# Patient Record
Sex: Female | Born: 1985 | Race: White | Hispanic: Yes | Marital: Single | Smoking: Never smoker
Health system: Southern US, Community
[De-identification: ages and names within clinical notes are randomized; demographics above are authoritative.]

## PROBLEM LIST (undated history)

## (undated) DIAGNOSIS — G40909 Epilepsy, unspecified, not intractable, without status epilepticus: Secondary | ICD-10-CM

---

## 2012-10-03 ENCOUNTER — Emergency Department (HOSPITAL_COMMUNITY)
Admission: EM | Admit: 2012-10-03 | Discharge: 2012-10-03 | Discharge status: Against Medical Advice | Payer: Self-pay | Attending: Emergency Medicine | Admitting: Emergency Medicine

## 2012-10-03 ENCOUNTER — Encounter (HOSPITAL_COMMUNITY): Payer: Self-pay | Admitting: Adult Health

## 2012-10-03 DIAGNOSIS — IMO0002 Reserved for concepts with insufficient information to code with codable children: Secondary | ICD-10-CM | POA: Insufficient documentation

## 2012-10-03 HISTORY — DX: Epilepsy, unspecified, not intractable, without status epilepticus: G40.909

## 2012-10-03 NOTE — ED Notes (Signed)
Called patient for room placement. No answer
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
## 2016-11-26 ENCOUNTER — Encounter: Payer: Self-pay | Admitting: Pediatric Intensive Care

## 2016-12-11 NOTE — Congregational Nurse Program (Signed)
Congregational Nurse Program Note  Date of Encounter: 11/26/2016  Past Medical History: Past Medical History:  Diagnosis Date  . Epilepsy     Encounter Details:     CNP Questionnaire - 11/26/16 1600      Patient Demographics   Is this a new or existing patient? New   Patient is considered a/an Immigrant   Race Latino/Hispanic     Patient Assistance   Location of Patient Assistance [employer]   Patient's financial/insurance status Self-Pay (Uninsured)   Uninsured Patient ([place] [employer]/Care Connects) Yes   Interventions Assisted patient in making appt.   Patient referred to apply for the following financial assistance Not Applicable   Food insecurities addressed Not Applicable   Transportation assistance No   Assistance securing medications No   Educational health offerings Navigating the healthcare system     Encounter Details   Primary purpose of visit Education/Health Concerns   Was an Emergency Department visit averted? Not Applicable   Does patient have a medical provider? No   Patient referred to Establish PCP   Was a mental health screening completed? (GAINS tool) No   Does patient have dental issues? No   Does patient have vision issues? No   Does your patient have an abnormal blood pressure today? No   Since previous encounter, have you referred patient for abnormal blood pressure that resulted in a new diagnosis or medication change? No   Does your patient have an abnormal blood glucose today? No   Since previous encounter, have you referred patient for abnormal blood glucose that resulted in a new diagnosis or medication change? No   Was there a life-saving intervention made? No    Via interpreter Alcario Droughtrica- client was supposed to come to clinic for evaluation this afternoon but had not transportation. Client describes history of seizures at "night" and was under care of [place]CP in [place] but has not had access to care or medication. Client describes headache and  "yellow eyes". Client would like to be evaluated by doctor locally. CN will call [facility] for appointment and call client back with time.

## 2016-12-23 ENCOUNTER — Ambulatory Visit (INDEPENDENT_AMBULATORY_CARE_PROVIDER_SITE_OTHER): Payer: Self-pay | Admitting: Physician Assistant

## 2017-01-01 ENCOUNTER — Ambulatory Visit (INDEPENDENT_AMBULATORY_CARE_PROVIDER_SITE_OTHER): Payer: Self-pay | Admitting: Physician Assistant

## 2017-01-01 ENCOUNTER — Encounter (INDEPENDENT_AMBULATORY_CARE_PROVIDER_SITE_OTHER): Payer: Self-pay | Admitting: Physician Assistant

## 2017-01-01 VITALS — BP 110/74 | HR 62 | Temp 97.9°F | Wt 174.0 lb

## 2017-01-01 DIAGNOSIS — G40909 Epilepsy, unspecified, not intractable, without status epilepticus: Secondary | ICD-10-CM

## 2017-01-01 MED ORDER — LEVETIRACETAM 250 MG PO TABS: 250.0000 mg | ORAL_TABLET | Freq: Two times a day (BID) | ORAL | 2 refills | Status: DC

## 2017-01-01 MED ORDER — CYCLOBENZAPRINE HCL 10 MG PO TABS: 10.0000 mg | ORAL_TABLET | Freq: Every day | ORAL | 0 refills | Status: DC

## 2017-01-01 NOTE — Patient Instructions (Signed)
Epilepsia  (Epilepsy)  La epilepsia es una afeccin en el que la persona tiene convulsiones repetidas con el paso del tiempo. Una convulsin es una rfaga repentina de actividad elctrica y qumica anormal en el cerebro. Las convulsiones pueden provocar un cambio en la atencin, la conducta o la capacidad de mantenerse despierto y alerta (estado mental alterado).  La epilepsia aumenta el riesgo de cadas, accidentes y lesiones. Tambin puede producir complicaciones, que incluyen lo siguiente:   Depresin.   Mala memoria.   Muerte sbita e inexplicable en epilepsia (SUDEP). Esta complicacin es poco frecuente, y no se conoce la causa.  La mayora de las personas con epilepsia tiene una vida normal.  CAUSAS  Esta afeccin puede ser causada por lo siguiente:   Traumatismo en la cabeza.   Una lesin que ocurre al nacer.   Fiebre alta durante la infancia.   Ictus.   Hemorragias en el cerebro o alrededor de este.   Ciertos medicamentos y drogas.   Tener muy poco oxgeno durante un perodo prolongado.   Desarrollo anormal del cerebro.   Ciertas infecciones, como meningitis y encefalitis.   Tumores cerebrales.   Afecciones que se transmiten de padres a hijos (hereditarias).  SNTOMAS  Los sntomas de una convulsin varan mucho de una persona a otra. Estos incluyen los siguientes:   Espasmos.   El cuerpo se entumece.   Movimientos involuntarios de los brazos o las piernas.   Prdida del conocimiento.   Problemas respiratorios.   Cada repentina.   Confusin.   Movimientos de asentimiento con la cabeza.   Parpadeo o movimientos de abrir y cerrar los ojos.   Chasquido de labios.   Babeo.   Movimientos rpidos de los ojos.   Gruidos.   Prdida del control del intestino y de la vejiga.   Mirar fijamente.   Falta de respuesta.  Algunas personas tienen sntomas apenas antes de que ocurra una convulsin (aura) e inmediatamente despus de la convulsin. Los sntomas del aura incluyen los  siguientes:   [place] o ansiedad.   Nuseas.   Sentir que la habitacin da vueltas (vrtigo).   Una sensacin de haber visto o escuchado algo antes (deja vu).   Sabores u olores extraos.   Cambios en la visin, como ver destellos de luz o manchas.  Los sntomas que ocurren despus de una convulsin incluyen los siguientes:   Confusin.   Somnolencia.   Dolor de cabeza.  DIAGNSTICO  Esta afeccin se diagnostica en funcin de lo siguiente:   Sus sntomas.   Sus antecedentes mdicos.   Un examen fsico.   Un examen neurolgico. Un examen neurolgico es similar a un examen fsico. Consiste en examinar la fuerza, los reflejos, la coordinacin y las sensaciones.   Estudios, por ejemplo:  ? Electroencefalograma (EEG). Este es un estudio indoloro que crea un diagrama de las ondas cerebrales.  ? Resonancia magntica (RM) del cerebro.  ? Tomografa computarizada (TC) del cerebro.  ? Una puncin lumbar, tambin llamada puncin espinal.  ? Anlisis de sangre para detectar signos de infeccin o bioqumica anormal de la sangre.  TRATAMIENTO  Esta afeccin no tiene cura, pero el tratamiento puede ayudar a controlar las convulsiones. El tratamiento puede incluir lo siguiente:   Tomar medicamentos para controlar las convulsiones. Estos incluyen medicamentos para prevenir convulsiones y medicamentos para detenerlas cuando ocurren.   Tener implantado en el trax un dispositivo llamado estimulador del nervio vago. El dispositivo enva impulsos elctricos al nervio vago y al cerebro para prevenir   las convulsiones. Se puede recomendar este tratamiento si los medicamentos no son de ayuda.   Ciruga de cerebro. Hay varios tipos de cirugas que se pueden realizar para impedir que ocurran las convulsiones o para reducir su frecuencia.   Realizarse anlisis de sangre peridicos. Es posible que deba realizarse anlisis de sangre de forma peridica para comprobar que est recibiendo la cantidad adecuada de medicamentos.  Una  vez que se diagnostica esta afeccin, es importante comenzar un tratamiento lo antes posible. En algunas personas, la epilepsia desaparece con el tiempo.  INSTRUCCIONES PARA EL CUIDADO EN EL HOGAR  Medicamentos   Tome los medicamentos de venta libre y los recetados solamente como se lo haya indicado el mdico.   Evite cualquier sustancia que pueda interferir con su medicamento, como el alcohol.  Actividad   Descanse lo suficiente. La falta de sueo puede aumentar la probabilidad de sufrir convulsiones.   Siga las instrucciones del mdico sobre conducir, nadar y realizar otras actividades que podran ser peligrosas si tuviera una convulsin.  Educar a los dems  Ensee a sus amigos y familiares lo que debe hacer si tiene una convulsin. Ellos deben:   Acostarlo en el suelo para evitar que se caiga.   Colocar un almohadn debajo de su cabeza y cuerpo.   Aflojar la ropa apretada alrededor de su cuello.   Recostarlo sobre un lado. En caso de tener vmitos, esto ayuda a mantener las vas areas despejadas.   Quedarse con usted hasta que se recupere.   No sostenerlo presionando hacia abajo. Hacer esto no detendr la convulsin.   No colocarle nada en la boca.   Saber si necesita atencin de emergencia o no.  Instrucciones generales   Evite cualquier cosa que le haya desencadenado una convulsin.   Lleve un diario de sus convulsiones. Registre lo que recuerde sobre cada convulsin, en especial, cualquier cosa que pueda haber desencadenado la convulsin.   Concurra a todas las visitas de control como se lo haya indicado el mdico. Esto es importante.  SOLICITE ATENCIN MDICA SI:   Su patrn de convulsiones cambia.   Tiene sntomas de infeccin u otra enfermedad. Esto podra aumentar el riesgo de tener una convulsin.  SOLICITE ATENCIN MDICA DE INMEDIATO SI:   Tiene una convulsin que no se detiene despus de 5minutos.   Tiene varias convulsiones consecutivas sin una recuperacin completa entre una  convulsin y la otra.   Tiene una convulsin que dificulta la respiracin.   Tiene una convulsin diferente de las convulsiones anteriores.   Tiene una convulsin que lo deja sin poder hablar ni usar una parte del cuerpo.   No se despert de inmediato despus de una convulsin.  Esta informacin no tiene como fin reemplazar el consejo del mdico. Asegrese de hacerle al mdico cualquier pregunta que tenga.  Document Released: 05/25/2005 Document Revised: 09/16/2015 Document Reviewed: 12/03/2015  Elsevier Interactive Patient Education  2018 Elsevier Inc.

## 2017-01-01 NOTE — Progress Notes (Signed)
Subjective:  Patient ID: Laura Wyatt, female    DOB: August 28, 1985  Age: 31 y.o. MRN: [id number]  CC:  Seizures and pain all over   HPI Laura Wyatt is a 31 y.o. female with a PMH of epilepsy presents with need to refill anti-convulsant, does not know the name. Says she does not take anti-convulsant every day because of extreme somnolence.  Has been out of anti-convulsant for one month now. Has been seeing an increase in frequency of seizure activity. Seizures tend to happen when she is asleep. Wakes up feeling as if her whole body is very tense and with muscle aches. Associated symptoms are dizziness and headache but only before and after a seizure. Denies tingling, numbness, weakness, paralysis, CP, palpitations, SOB, abdominal pain, f/c/n/v, rash, GI/GU sxs.   ROS Review of Systems  Constitutional: Negative for chills, fever and malaise/fatigue.  Eyes: Negative for blurred vision.  Respiratory: Negative for shortness of breath.   Cardiovascular: Negative for chest pain and palpitations.  Gastrointestinal: Negative for abdominal pain and nausea.  Genitourinary: Negative for dysuria and hematuria.  Musculoskeletal: Negative for joint pain and myalgias.  Skin: Negative for rash.  Neurological: Positive for headaches. Negative for tingling.  Psychiatric/Behavioral: Negative for depression. The patient is not nervous/anxious.     Objective:  BP 110/74 (BP Location: Left Arm, Patient Position: Sitting, Cuff Size: Normal)   Pulse 62   Temp 97.9 F (36.6 C) (Oral)   Wt 174 lb (78.9 kg)   LMP 12/31/2016 (Exact Date)   SpO2 99%   BP/Weight 01/01/2017 10/03/2012  Systolic BP 110 116  Diastolic BP 74 81  Wt. (Lbs) 174 -      Physical Exam  Constitutional: She is oriented to person, place, and time.  Well developed, well nourished, NAD, polite  HENT:  Head: Normocephalic and atraumatic.  Eyes: No scleral icterus.  Neck: Normal range of motion. Neck supple. No thyromegaly  present.  Cardiovascular: Normal rate, regular rhythm and normal heart sounds.   Pulmonary/Chest: Effort normal and breath sounds normal.  Musculoskeletal: She exhibits no edema.  Neurological: She is alert and oriented to person, place, and time.  Skin: Skin is warm and dry. No rash noted. No erythema. No pallor.  Psychiatric: She has a normal mood and affect. Her behavior is normal. Thought content normal.  Vitals reviewed.    Assessment & Plan:   1. Nonintractable epilepsy without status epilepticus, unspecified epilepsy type (HCC) - Begin levETIRAcetam (KEPPRA) 250 MG tablet; Take 1 tablet (250 mg total) by mouth 2 (two) times daily.  Dispense: 60 tablet; Refill: 2 - Ambulatory referral to Neurology - CBC with Differential - Comprehensive metabolic panel - Begin cyclobenzaprine (FLEXERIL) 10 MG tablet; Take 1 tablet (10 mg total) by mouth at bedtime.  Dispense: 15 tablet; Refill: 0   Meds ordered this encounter  Medications  . levETIRAcetam (KEPPRA) 250 MG tablet    Sig: Take 1 tablet (250 mg total) by mouth 2 (two) times daily.    Dispense:  60 tablet    Refill:  2    Order Specific Question:   Supervising Provider    Answer:   Quentin AngstJEGEDE, OLUGBEMIGA E [id number]]  . cyclobenzaprine (FLEXERIL) 10 MG tablet    Sig: Take 1 tablet (10 mg total) by mouth at bedtime.    Dispense:  15 tablet    Refill:  0    Order Specific Question:   Supervising Provider    Answer:   Quentin AngstJEGEDE, OLUGBEMIGA E [id number]  Follow-up: Return in about 2 weeks (around 01/15/2017) for epilepsy and lab draw.   Loletta Specteroger David Gomez PA

## 2017-01-15 ENCOUNTER — Other Ambulatory Visit (INDEPENDENT_AMBULATORY_CARE_PROVIDER_SITE_OTHER): Payer: Self-pay

## 2019-05-13 ENCOUNTER — Emergency Department
Admission: EM | Admit: 2019-05-13 | Discharge: 2019-05-13 | Disposition: A | Discharge status: Home / Self Care | Payer: Self-pay | Attending: Student | Admitting: Student

## 2019-05-13 ENCOUNTER — Other Ambulatory Visit: Payer: Self-pay

## 2019-05-13 DIAGNOSIS — Z79899 Other long term (current) drug therapy: Secondary | ICD-10-CM | POA: Insufficient documentation

## 2019-05-13 DIAGNOSIS — R569 Unspecified convulsions: Secondary | ICD-10-CM

## 2019-05-13 DIAGNOSIS — G40409 Other generalized epilepsy and epileptic syndromes, not intractable, without status epilepticus: Secondary | ICD-10-CM | POA: Insufficient documentation

## 2019-05-13 LAB — CBC WITH DIFFERENTIAL/PLATELET
Abs Immature Granulocytes: 0.04 10*3/uL (ref 0.00–0.07)
Basophils Absolute: 0 10*3/uL (ref 0.0–0.1)
Basophils Relative: 0 %
Eosinophils Absolute: 0.1 10*3/uL (ref 0.0–0.5)
Eosinophils Relative: 1 %
HCT: 32.2 % — ABNORMAL LOW (ref 36.0–46.0)
Hemoglobin: 10.3 g/dL — ABNORMAL LOW (ref 12.0–15.0)
Immature Granulocytes: 0 %
Lymphocytes Relative: 16 %
Lymphs Abs: 1.7 10*3/uL (ref 0.7–4.0)
MCH: 25.7 pg — ABNORMAL LOW (ref 26.0–34.0)
MCHC: 32 g/dL (ref 30.0–36.0)
MCV: 80.3 fL (ref 80.0–100.0)
Monocytes Absolute: 0.7 10*3/uL (ref 0.1–1.0)
Monocytes Relative: 6 %
Neutro Abs: 8.3 10*3/uL — ABNORMAL HIGH (ref 1.7–7.7)
Neutrophils Relative %: 77 %
Platelets: 314 10*3/uL (ref 150–400)
RBC: 4.01 MIL/uL (ref 3.87–5.11)
RDW: 14.7 % (ref 11.5–15.5)
WBC: 10.9 10*3/uL — ABNORMAL HIGH (ref 4.0–10.5)
nRBC: 0 % (ref 0.0–0.2)

## 2019-05-13 LAB — COMPREHENSIVE METABOLIC PANEL
ALT: 40 U/L (ref 0–44)
AST: 57 U/L — ABNORMAL HIGH (ref 15–41)
Albumin: 4 g/dL (ref 3.5–5.0)
Alkaline Phosphatase: 71 U/L (ref 38–126)
Anion gap: 11 (ref 5–15)
BUN: 10 mg/dL (ref 6–20)
CO2: 20 mmol/L — ABNORMAL LOW (ref 22–32)
Calcium: 8.4 mg/dL — ABNORMAL LOW (ref 8.9–10.3)
Chloride: 106 mmol/L (ref 98–111)
Creatinine, Ser: 0.78 mg/dL (ref 0.44–1.00)
GFR calc Af Amer: >60 mL/min (ref >60)
GFR calc non Af Amer: >60 mL/min (ref >60)
Glucose, Bld: 132 mg/dL — ABNORMAL HIGH (ref 70–99)
Potassium: 4.2 mmol/L (ref 3.5–5.1)
Sodium: 137 mmol/L (ref 135–145)
Total Bilirubin: 0.6 mg/dL (ref 0.3–1.2)
Total Protein: 7.7 g/dL (ref 6.5–8.1)

## 2019-05-13 LAB — HCG, QUANTITATIVE, PREGNANCY: hCG, Beta Chain, Quant, S: <1 m[IU]/mL (ref <5)

## 2019-05-13 MED ORDER — LORAZEPAM 2 MG/ML IJ SOLN
2.0000 mg | Freq: Once | INTRAMUSCULAR | Status: AC
  Administered 2019-05-13: 02:00:00 2 mg via INTRAVENOUS

## 2019-05-13 MED ORDER — LORAZEPAM 2 MG/ML IJ SOLN
INTRAMUSCULAR | Status: AC
  Administered 2019-05-13: 2 mg
  Filled 2019-05-13: qty 1

## 2019-05-13 MED ORDER — SODIUM CHLORIDE 0.9 % IV SOLN
2000.0000 mg | Freq: Once | INTRAVENOUS | Status: AC
  Administered 2019-05-13: 2000 mg via INTRAVENOUS
  Filled 2019-05-13: qty 20

## 2019-05-13 MED ORDER — LEVETIRACETAM 500 MG PO TABS: 500.0000 mg | ORAL_TABLET | Freq: Two times a day (BID) | ORAL | 1 refills | Status: DC

## 2019-05-13 MED ORDER — LORAZEPAM 2 MG/ML IJ SOLN
INTRAMUSCULAR | Status: AC
  Administered 2019-05-13: 2 mg via INTRAVENOUS
  Filled 2019-05-13: qty 1

## 2019-05-13 NOTE — ED Notes (Signed)
Pt ambulated out of department with friend. Interpreter used to give d/c instructions. Pt agreeable to plan.

## 2019-05-13 NOTE — Discharge Instructions (Addendum)
Seizures may happen at any time. It is important to take certain precautions to maintain your safety.   Follow up with your general doctor in 1-3 days. Please call and make an appointment to establish care with a Neurology (seizure) doctor.  PLEASE TAKE YOUR MEDICATION AS PRESCRIBED.  During a seizure, a person may injure himself or herself. Seizure precautions are guidelines that a person can follow in order to minimize injury during a seizure. For any activity, it is important to ask, "What would happen if I had a seizure while doing this?" Follow the below precautions.   Bathroom Safety  A person with seizures may want to shower instead of bathe to avoid accidental drowning. If falls occur during the patient's typical seizure, a person should use a shower seat, preferably one with a safety strap.  Use nonskid strips in your shower or tub.  Never use electrical equipment near water. This prevents accidental electrocution.  Consider changing glass in shower doors to shatterproof glass.  Risk analyst If possible, cook when someone else is nearby.  Use the back burners of the stove to prevent accidental burns.  Use shatterproof containers as much as possible. For instance, sauces can be transferred from glass bottles to plastic containers for use.  Limit time that is required using knives or other sharp objects. If possible, buy foods that are already cut, or ask someone to help in meal preparation.   General Safety at [place] not smoke or light fires in the fireplace unless someone else is present.  Do not use space heaters that can be accidentally overturned.  When alone, avoid using step stools or ladders, and do not clean rooftop gutters.  Purchase power tools and motorized Company secretary which have a safety switch that will stop the machine if you release the handle (a 'dead man's' switch).   Driving and Transportation DO NOT [place] and/or you  have permission to drive from your state's Department of Motor Vehicles  [facility]). Each state has different laws. Please refer to the following link on the [place] website for more information: http://www.epilepsyfoundation.org/answerplace/Social/driving/drivingu.cfm  If you ride a bicycle, wear a helmet and any other necessary protective gear.  When taking public transportation like the bus or subway, stay clear of the platform edge.   Outdoor Insurance underwriter is okay, but does present certain risks. Never swim alone, and tell friends what to do if you have a seizure while swimming.  Wear appropriate protective equipment.  Ski with a friend. If a seizure occurs, your friend can seek help, if needed. He or she can also help to get you out of the cold. Consider using a safety hook or belt while riding the ski lift.

## 2019-05-13 NOTE — ED Triage Notes (Signed)
Pt arrived via [facility] from home with seizures. Pt had a 10 min tonic clonic seizure and was post-ictal on EMS arrival. Pt also bit her tongue. Pt has a hx of seizures. Pt calm and cooperative. Pt is non-compliant with her medications per ems.

## 2019-05-13 NOTE — ED Notes (Signed)
Pt awake at this time. Speaking in [place]. Interpreter requested. Diet ordered. Will reassess once interpreter available for possible d/c.

## 2019-05-13 NOTE — ED Notes (Signed)
While ambulating out of department, pt c/o dizziness. Pt sat in chair. Vital signs within [place] limits. Monks MD notified. Taken to vehicle via w/c for d/c via POV.

## 2019-05-13 NOTE — ED Provider Notes (Signed)
8:15 AM Patient awake, alert, oriented, at baseline mental status. Re-evaluated w/ Spanish interpreter.  She admits to inconsistent adherence to her antiepileptic, often skipping her morning dose when she is rushing to work.  Discussed the importance of strict medication adherance, she voices understanding.  Discussed seizure precautions, including avoidance of driving until cleared by Neurology.  Placed referral for follow-up with Neurology here, as she states she does not have one established currently.  Provided an additional prescription for her Keppra as needed.  Patient voices understanding and is comfortable with the plan and discharge. She has a co-worker here who is comfortable giving her a ride home. Given return precautions.    Lilia Pro., MD 05/13/19 [phone]

## 2019-05-13 NOTE — ED Notes (Signed)
This RN in room with pt when pt began to stare straight ahead and would not answer any questions. Pt's head then began to turn to the right and she begin to seize. Medication given, EDP at bedside.

## 2019-05-13 NOTE — ED Provider Notes (Signed)
[facility] [place] Emergency Department Provider Note  ____________________________________________  Time seen: Approximately 2:59 AM  I have reviewed the triage vital signs and the nursing notes.   HISTORY  Chief Complaint Seizures  Level 5 caveat:  Portions of the history and physical were unable to be obtained due to post-ictal   HPI Laura Wyatt is a 33 y.o. female the history of epilepsy and medication noncompliance who presents via EMS for seizure.  Patient had a 10-minute tonic-clonic generalized seizure at home.  When EMS arrived patient was postictal.  She bit her tongue and was incontinent of urine.  Upon arrival to the emergency room patient was still slightly confused but able to tell is that she had been taking her medications since August.  She is supposed to be on Keppra.  Immediately upon arrival patient had another generalized tonic-clonic seizure.  No further history able to be obtained.   Past Medical History:  Diagnosis Date  . Epilepsy (HCC)     Prior to Admission medications   Medication Sig Start Date End Date Taking? Authorizing Provider  cyclobenzaprine (FLEXERIL) 10 MG tablet Take 1 tablet (10 mg total) by mouth at bedtime. 01/01/17   Loletta Specter, PA-C  levETIRAcetam (KEPPRA) 500 MG tablet Take 1 tablet (500 mg total) by mouth 2 (two) times daily. 05/13/19   Nita Sickle, MD    Allergies Patient has no known allergies.  History reviewed. No pertinent family history.  Social History Social History   Tobacco Use  . Smoking status: Never Smoker  . Smokeless tobacco: Never Used  Substance Use Topics  . Alcohol use: Not on file  . Drug use: Not on file    Review of Systems  Constitutional: Negative for fever. Neurological: + seizure   ____________________________________________   PHYSICAL EXAM:  VITAL SIGNS: ED Triage Vitals  Enc Vitals Group     BP 05/13/19 0207 (!) 120/91     Pulse Rate 05/13/19  0211 (!) 123     Resp 05/13/19 0207 16     Temp 05/13/19 0207 99 F (37.2 C)     Temp Source 05/13/19 0207 Oral     SpO2 05/13/19 0207 98 %     Weight 05/13/19 0208 130 lb (59 kg)     Height 05/13/19 0208 5' (1.524 m)     Head Circumference --      Peak Flow --      Pain Score 05/13/19 0208 6     Pain Loc --      Pain Edu? --      Excl. in GC? --     Constitutional: Post-ictal HEENT:      Head: Normocephalic and atraumatic.         Eyes: Conjunctivae are normal. Sclera is non-icteric.       Mouth/Throat: Mucous membranes are moist.       Neck: Supple with no signs of meningismus. Cardiovascular: Tachycardic with regular rhythm. No murmurs, gallops, or rubs. 2+ symmetrical distal pulses are present in all extremities. No JVD. Respiratory: Normal respiratory effort. Lungs are clear to auscultation bilaterally. No wheezes, crackles, or rhonchi.  Gastrointestinal: Soft, non tender, and non distended with positive bowel sounds. No rebound or guarding. Musculoskeletal: Nontender with normal range of motion in all extremities. No edema, cyanosis, or erythema of extremities. Neurologic: Normal speech and language. Face is symmetric. Moving all extremities. No gross focal neurologic deficits are appreciated. Skin: Skin is warm, dry and intact. No rash noted.  ____________________________________________   LABS (all labs ordered are listed, but only abnormal results are displayed)  Labs Reviewed  CBC WITH DIFFERENTIAL/PLATELET - Abnormal; Notable for the following components:      Result Value   WBC 10.9 (*)    Hemoglobin 10.3 (*)    HCT 32.2 (*)    MCH 25.7 (*)    Neutro Abs 8.3 (*)    All other components within normal limits  COMPREHENSIVE METABOLIC PANEL - Abnormal; Notable for the following components:   CO2 20 (*)    Glucose, Bld 132 (*)    Calcium 8.4 (*)    AST 57 (*)    All other components within normal limits  HCG, QUANTITATIVE, PREGNANCY  CBG MONITORING, ED  POC  URINE PREG, ED   ____________________________________________  EKG  ED ECG REPORT I, Nita Sicklearolina Trevelle Mcgurn, the attending physician, personally viewed and interpreted this ECG.  Sinus tachycardia, rate of 121, normal intervals, normal axis, no ST elevations or depressions.  Otherwise normal EKG. ____________________________________________  RADIOLOGY  none  ____________________________________________   PROCEDURES  Procedure(s) performed: None Procedures Critical Care performed:  None ____________________________________________   INITIAL IMPRESSION / ASSESSMENT AND PLAN / ED COURSE  33 y.o. female the history of epilepsy and medication noncompliance who presents via EMS for seizure.  Patient with 1 generalized tonic-clonic seizure at home and another one in the emergency room.  Has not been compliant with her medications since August.  Patient is currently postictal but maintaining her airway and breathing normally.  Labs showing no electrolyte derangements or hypoglycemia.  Patient was given 2 mg of IV Ativan.  Patient was loaded with 2000 mg of Keppra.  Will monitor for return to baseline.  Clinical Course as of May 12 644  Sat May 13, 2019  0500 Patient still somewhat confused and slow to answer questions but mental status is improving. She is easily arousable, answers to simple questions. No further episodes of seizures. Will continue to monitor.   [CV]  [id number] Mental status continues to improve. Will give breakfast, ambulate, and call family for disposition. Care transferred to incoming MD at 7AM   [CV]    Clinical Course User Index [CV] Don PerkingVeronese, [place], MD      As part of my medical decision making, I reviewed the following data within the electronic MEDICAL RECORD NUMBER Nursing notes reviewed and incorporated, Labs reviewed , EKG interpreted , Old EKG reviewed, Old chart reviewed, Notes from prior ED visits and [place] Controlled Substance Database   Please note:  Patient  was evaluated in Emergency Department today for the symptoms described in the history of present illness. Patient was evaluated in the context of the global COVID-19 pandemic, which necessitated consideration that the patient might be at risk for infection with the SARS-CoV-2 virus that causes COVID-19. Institutional protocols and algorithms that pertain to the evaluation of patients at risk for COVID-19 are in a state of rapid change based on information released by regulatory bodies including the CDC and federal and state organizations. These policies and algorithms were followed during the patient's care in the ED.  Some ED evaluations and interventions may be delayed as a result of limited staffing during the pandemic.   ____________________________________________   FINAL CLINICAL IMPRESSION(S) / ED DIAGNOSES   Final diagnoses:  Seizure (HCC)      NEW MEDICATIONS STARTED DURING THIS VISIT:  ED Discharge Orders         Ordered    levETIRAcetam (KEPPRA) 500 MG tablet  2 times daily     05/13/19 1655           Note:  This document was prepared using Dragon voice recognition software and may include unintentional dictation errors.    Rudene Re, MD 05/13/19 [phone]

## 2019-06-21 ENCOUNTER — Other Ambulatory Visit: Payer: Self-pay

## 2019-06-21 ENCOUNTER — Emergency Department
Admission: EM | Admit: 2019-06-21 | Discharge: 2019-06-21 | Disposition: A | Discharge status: Home / Self Care | Payer: Self-pay | Attending: Emergency Medicine | Admitting: Emergency Medicine

## 2019-06-21 DIAGNOSIS — G40909 Epilepsy, unspecified, not intractable, without status epilepticus: Secondary | ICD-10-CM | POA: Insufficient documentation

## 2019-06-21 DIAGNOSIS — Z79899 Other long term (current) drug therapy: Secondary | ICD-10-CM | POA: Insufficient documentation

## 2019-06-21 LAB — BASIC METABOLIC PANEL
Anion gap: 9 (ref 5–15)
BUN: 12 mg/dL (ref 6–20)
CO2: 19 mmol/L — ABNORMAL LOW (ref 22–32)
Calcium: 8.6 mg/dL — ABNORMAL LOW (ref 8.9–10.3)
Chloride: 107 mmol/L (ref 98–111)
Creatinine, Ser: 0.67 mg/dL (ref 0.44–1.00)
GFR calc Af Amer: >60 mL/min (ref >60)
GFR calc non Af Amer: >60 mL/min (ref >60)
Glucose, Bld: 107 mg/dL — ABNORMAL HIGH (ref 70–99)
Potassium: 4.2 mmol/L (ref 3.5–5.1)
Sodium: 135 mmol/L (ref 135–145)

## 2019-06-21 LAB — CBC WITH DIFFERENTIAL/PLATELET
Abs Immature Granulocytes: 0.09 10*3/uL — ABNORMAL HIGH (ref 0.00–0.07)
Basophils Absolute: 0 10*3/uL (ref 0.0–0.1)
Basophils Relative: 0 %
Eosinophils Absolute: 0 10*3/uL (ref 0.0–0.5)
Eosinophils Relative: 0 %
HCT: 34.4 % — ABNORMAL LOW (ref 36.0–46.0)
Hemoglobin: 11.2 g/dL — ABNORMAL LOW (ref 12.0–15.0)
Immature Granulocytes: 1 %
Lymphocytes Relative: 7 %
Lymphs Abs: 1.1 10*3/uL (ref 0.7–4.0)
MCH: 26 pg (ref 26.0–34.0)
MCHC: 32.6 g/dL (ref 30.0–36.0)
MCV: 79.8 fL — ABNORMAL LOW (ref 80.0–100.0)
Monocytes Absolute: 0.6 10*3/uL (ref 0.1–1.0)
Monocytes Relative: 4 %
Neutro Abs: 13.4 10*3/uL — ABNORMAL HIGH (ref 1.7–7.7)
Neutrophils Relative %: 88 %
Platelets: 323 10*3/uL (ref 150–400)
RBC: 4.31 MIL/uL (ref 3.87–5.11)
RDW: 13.6 % (ref 11.5–15.5)
WBC: 15.3 10*3/uL — ABNORMAL HIGH (ref 4.0–10.5)
nRBC: 0 % (ref 0.0–0.2)

## 2019-06-21 MED ORDER — LEVETIRACETAM IN NACL 1000 MG/100ML IV SOLN
1000.0000 mg | Freq: Once | INTRAVENOUS | Status: AC
  Administered 2019-06-21: 1000 mg via INTRAVENOUS
  Filled 2019-06-21: qty 100

## 2019-06-21 NOTE — ED Provider Notes (Signed)
[facility] Emergency Department Provider Note ____________________________________________   First MD Initiated Contact with Patient 06/21/19 0930     (approximate)  I have reviewed the triage vital signs and the nursing notes.   HISTORY  Chief Complaint Seizures  Level 5 caveat: History present illness limited due to altered mental status  HPI Laura Wyatt is a 34 y.o. female with PMH as noted below including history of a seizure disorder who is apparently noncompliant with medication and who presents after several seizures.  EMS reported that they were called out several hours ago, but the patient refused transport at that time.  The patient is somnolent and postictal and unable to give any history.  Past Medical History:  Diagnosis Date  . Epilepsy (HCC)     There are no problems to display for this patient.   History reviewed. No pertinent surgical history.  Prior to Admission medications   Medication Sig Start Date End Date Taking? Authorizing Provider  cyclobenzaprine (FLEXERIL) 10 MG tablet Take 1 tablet (10 mg total) by mouth at bedtime. 01/01/17   Loletta Specter, PA-C  levETIRAcetam (KEPPRA) 500 MG tablet Take 1 tablet (500 mg total) by mouth 2 (two) times daily. 05/13/19   Nita Sickle, MD    Allergies Patient has no known allergies.  History reviewed. No pertinent family history.  Social History Social History   Tobacco Use  . Smoking status: Never Smoker  . Smokeless tobacco: Never Used  Substance Use Topics  . Alcohol use: Not on file  . Drug use: Not on file    Review of Systems Level 5 caveat: Unable to obtain review of systems due to altered mental status    ____________________________________________   PHYSICAL EXAM:  VITAL SIGNS: ED Triage Vitals  Enc Vitals Group     BP 06/21/19 0926 (!) 158/128     Pulse Rate 06/21/19 0926 (!) 104     Resp 06/21/19 0926 16     Temp --      Temp src --     SpO2 06/21/19 0926 99 %     Weight 06/21/19 0927 191 lb 12.8 oz (87 kg)     Height 06/21/19 0927 5\' 4"  (1.626 m)     Head Circumference --      Peak Flow --      Pain Score --      Pain Loc --      Pain Edu? --      Excl. in GC? --     Constitutional: Somnolent but arousable.  Comfortable appearing. Eyes: Conjunctivae are normal.  EOMI.  PERRLA. Head: Atraumatic. Nose: No congestion/rhinnorhea. Mouth/Throat: Mucous membranes are moist.   Neck: Normal range of motion.  Cardiovascular: Borderline tachycardic, regular rhythm. Grossly normal heart sounds.  Good peripheral circulation. Respiratory: Normal respiratory effort.  No retractions. Lungs CTAB. Gastrointestinal: Soft and nontender. No distention.  Genitourinary: No flank tenderness. Musculoskeletal: No lower extremity edema.  Extremities warm and well perfused.  Neurologic: Motor intact in all extremities. Skin:  Skin is warm and dry. No rash noted. Psychiatric: Unable to assess.  ____________________________________________   LABS (all labs ordered are listed, but only abnormal results are displayed)  Labs Reviewed  BASIC METABOLIC PANEL - Abnormal; Notable for the following components:      Result Value   CO2 19 (*)    Glucose, Bld 107 (*)    Calcium 8.6 (*)    All other components within normal limits  CBC WITH  DIFFERENTIAL/PLATELET - Abnormal; Notable for the following components:   WBC 15.3 (*)    Hemoglobin 11.2 (*)    HCT 34.4 (*)    MCV 79.8 (*)    Neutro Abs 13.4 (*)    Abs Immature Granulocytes 0.09 (*)    All other components within normal limits   ____________________________________________  EKG  ED ECG REPORT I, Arta Silence, the attending physician, personally viewed and interpreted this ECG.  Date: 06/21/2019 EKG Time: 1009 Rate: 101 Rhythm: normal sinus rhythm QRS Axis: normal Intervals: normal ST/T Wave abnormalities: normal Narrative Interpretation: no evidence of acute  ischemia  ____________________________________________  RADIOLOGY    ____________________________________________   PROCEDURES  Procedure(s) performed: No  Procedures  Critical Care performed: No ____________________________________________   INITIAL IMPRESSION / ASSESSMENT AND PLAN / ED COURSE  Pertinent labs & imaging results that were available during my care of the patient were reviewed by me and considered in my medical decision making (see chart for details).  34 year old female with PMH as noted above presents after multiple apparent seizures early this morning.  The patient is somnolent and appears postictal.  She is unable to give any further history.  I reviewed the past medical records in [place].  The patient was most recently seen in the ED last month after a seizure, and apparently had been noncompliant with her Keppra at that time.    On exam, the patient is somnolent but arousable and is able to say her name.  There is no evidence of trauma.  Motor is intact in all extremities.  The neurologic exam is otherwise nonfocal.  Her vital signs are normal except for borderline tachycardia on arrival.  Overall presentation is consistent with a postictal state related to a generalized seizure.  I have ordered IV Keppra to load the patient.  She received Versed by EMS.  We will monitor her for return to baseline mental status, obtain labs, and reassess.  There is no indication for imaging at this time.  ----------------------------------------- 12:49 PM on 06/21/2019 -----------------------------------------  The patient is now alert and oriented x4.  She has had no further seizures in the ED.  I had an extensive discussion with her via the in person Spanish interpreter.  The patient states that she had not taken her Keppra for the last few days because she ran out of it, but she states that she has a prescription ready at the pharmacy that she has to pick up.  Given that  we can confirm that the patient has missed at least several doses, I suspect that this is the most likely reason for her seizures today rather than breakthrough seizures that would require a change in her medication regimen.  I counseled the patient extensively on the importance of being diligent about her medication, and I answered all of her questions and I gave her thorough return precautions.  She expressed understanding.  She is stable for discharge at this time. ____________________________________________   FINAL CLINICAL IMPRESSION(S) / ED DIAGNOSES  Final diagnoses:  Seizure disorder ([place])      NEW MEDICATIONS STARTED DURING THIS VISIT:  New Prescriptions   No medications on file     Note:  This document was prepared using Dragon voice recognition software and may include unintentional dictation errors.    Arta Silence, MD 06/21/19 1251

## 2019-06-21 NOTE — ED Triage Notes (Signed)
Pt arrives via ems from home for seizures. EMS states pt refused first time they went out but was called back for another seizure. Ems states pt was face down in floor, postictal on their arrival and pt ran out of meds and has had 4 seizures since midnight . EMS administered 2mg  versed in route. Pt still postictal on arrival. Spanish speaking

## 2019-06-21 NOTE — ED Notes (Signed)
Interpreter used to go over d/c instructions

## 2019-07-29 ENCOUNTER — Encounter: Payer: Self-pay | Admitting: Emergency Medicine

## 2019-07-29 ENCOUNTER — Emergency Department
Admission: EM | Admit: 2019-07-29 | Discharge: 2019-07-30 | Disposition: A | Discharge status: Home / Self Care | Payer: Self-pay | Attending: Student | Admitting: Student

## 2019-07-29 ENCOUNTER — Emergency Department: Payer: Self-pay

## 2019-07-29 ENCOUNTER — Other Ambulatory Visit: Payer: Self-pay

## 2019-07-29 DIAGNOSIS — R569 Unspecified convulsions: Secondary | ICD-10-CM | POA: Insufficient documentation

## 2019-07-29 DIAGNOSIS — Z3202 Encounter for pregnancy test, result negative: Secondary | ICD-10-CM | POA: Insufficient documentation

## 2019-07-29 DIAGNOSIS — Z79899 Other long term (current) drug therapy: Secondary | ICD-10-CM | POA: Insufficient documentation

## 2019-07-29 LAB — URINALYSIS, COMPLETE (UACMP) WITH MICROSCOPIC
Bacteria, UA: NONE SEEN
Bilirubin Urine: NEGATIVE
Glucose, UA: NEGATIVE mg/dL
Hgb urine dipstick: NEGATIVE
Ketones, ur: NEGATIVE mg/dL
Leukocytes,Ua: NEGATIVE
Nitrite: NEGATIVE
Protein, ur: NEGATIVE mg/dL
Specific Gravity, Urine: 1.015 (ref 1.005–1.030)
pH: 7 (ref 5.0–8.0)

## 2019-07-29 LAB — COMPREHENSIVE METABOLIC PANEL
ALT: 22 U/L (ref 0–44)
AST: 29 U/L (ref 15–41)
Albumin: 4 g/dL (ref 3.5–5.0)
Alkaline Phosphatase: 100 U/L (ref 38–126)
Anion gap: 10 (ref 5–15)
BUN: 14 mg/dL (ref 6–20)
CO2: 24 mmol/L (ref 22–32)
Calcium: 8.7 mg/dL — ABNORMAL LOW (ref 8.9–10.3)
Chloride: 103 mmol/L (ref 98–111)
Creatinine, Ser: 0.63 mg/dL (ref 0.44–1.00)
GFR calc Af Amer: >60 mL/min (ref >60)
GFR calc non Af Amer: >60 mL/min (ref >60)
Glucose, Bld: 131 mg/dL — ABNORMAL HIGH (ref 70–99)
Potassium: 3.6 mmol/L (ref 3.5–5.1)
Sodium: 137 mmol/L (ref 135–145)
Total Bilirubin: 0.4 mg/dL (ref 0.3–1.2)
Total Protein: 7.8 g/dL (ref 6.5–8.1)

## 2019-07-29 LAB — CBC WITH DIFFERENTIAL/PLATELET
Abs Immature Granulocytes: 0.04 10*3/uL (ref 0.00–0.07)
Basophils Absolute: 0 10*3/uL (ref 0.0–0.1)
Basophils Relative: 1 %
Eosinophils Absolute: 0.1 10*3/uL (ref 0.0–0.5)
Eosinophils Relative: 1 %
HCT: 35.2 % — ABNORMAL LOW (ref 36.0–46.0)
Hemoglobin: 12 g/dL (ref 12.0–15.0)
Immature Granulocytes: 1 %
Lymphocytes Relative: 33 %
Lymphs Abs: 2.9 10*3/uL (ref 0.7–4.0)
MCH: 27.4 pg (ref 26.0–34.0)
MCHC: 34.1 g/dL (ref 30.0–36.0)
MCV: 80.4 fL (ref 80.0–100.0)
Monocytes Absolute: 0.5 10*3/uL (ref 0.1–1.0)
Monocytes Relative: 6 %
Neutro Abs: 5.2 10*3/uL (ref 1.7–7.7)
Neutrophils Relative %: 58 %
Platelets: 346 10*3/uL (ref 150–400)
RBC: 4.38 MIL/uL (ref 3.87–5.11)
RDW: 13.5 % (ref 11.5–15.5)
WBC: 8.8 10*3/uL (ref 4.0–10.5)
nRBC: 0 % (ref 0.0–0.2)

## 2019-07-29 LAB — PREGNANCY, URINE: Preg Test, Ur: NEGATIVE

## 2019-07-29 MED ORDER — LEVETIRACETAM 500 MG PO TABS: 500.0000 mg | ORAL_TABLET | Freq: Two times a day (BID) | ORAL | 0 refills | Status: DC

## 2019-07-29 MED ORDER — SODIUM CHLORIDE 0.9 % IV BOLUS
1000.0000 mL | Freq: Once | INTRAVENOUS | Status: AC
  Administered 2019-07-29: 1000 mL via INTRAVENOUS

## 2019-07-29 MED ORDER — LEVETIRACETAM IN NACL 500 MG/100ML IV SOLN
500.0000 mg | Freq: Once | INTRAVENOUS | Status: AC
  Administered 2019-07-29: 500 mg via INTRAVENOUS
  Filled 2019-07-29: qty 100

## 2019-07-29 NOTE — ED Triage Notes (Signed)
Pt arrives POV to triage with c/o AMS. Pt was actively vomiting in car upon arrival and has urinated on herself. Pt was prescribed Levetiracetam on 07/10/2019 here in the ED and per family took her last dose tonight. Pt is nodding at questions but is not talking much to this RN at this time. Family knows no other medications or other that pt has taken today.

## 2019-07-29 NOTE — ED Notes (Signed)
First Nurse observed patient being brought in by family, Laura Wyatt and security.  Patient with very unsteady gait having to be supported by 3 people.  Per family patient had a seizure.  Family also reports that patient is out of her medications.

## 2019-07-29 NOTE — ED Provider Notes (Addendum)
[facility] Emergency Department Provider Note  ____________________________________________   First MD Initiated Contact with Patient 07/29/19 1947     (approximate)  I have reviewed the triage vital signs and the nursing notes.  History  Chief Complaint Altered Mental Status    HPI Laura Wyatt is a 34 y.o. female with hx of epilepsy who presents for seizure/AMS. Patient states she was in the passenger seat, her friend was driving her home from work. This is the last thing she recalls before being the in ED. Driver reports seizure like activity. On initial arrival to the ED patient noted to have urinary incontinence and was noted to be post ictal.   By my evaluation patient is back to baseline, alert and oriented. She does admit to missing doses of her Keppra as she is often busy with work or with her children.   Hx obtained w/ assistance of Spanish interpretor.   Past Medical Hx Past Medical History:  Diagnosis Date  . Epilepsy (HCC)     Problem List There are no problems to display for this patient.   Past Surgical Hx History reviewed. No pertinent surgical history.  Medications Prior to Admission medications   Medication Sig Start Date End Date Taking? Authorizing Provider  cyclobenzaprine (FLEXERIL) 10 MG tablet Take 1 tablet (10 mg total) by mouth at bedtime. 01/01/17   Loletta Specter, PA-C  levETIRAcetam (KEPPRA) 500 MG tablet Take 1 tablet (500 mg total) by mouth 2 (two) times daily. 05/13/19   Nita Sickle, MD    Allergies Patient has no known allergies.  Family Hx No family history on file.  Social Hx Social History   Tobacco Use  . Smoking status: Never Smoker  . Smokeless tobacco: Never Used  Substance Use Topics  . Alcohol use: Never  . Drug use: Never     Review of Systems  Constitutional: Negative for fever, chills. Eyes: Negative for visual changes. ENT: Negative for sore throat. Cardiovascular:  Negative for chest pain. Respiratory: Negative for shortness of breath. Gastrointestinal: Negative for nausea, vomiting.  Genitourinary: Negative for dysuria. Musculoskeletal: Negative for leg swelling. Skin: Negative for rash. Neurological: + seizure   Physical Exam  Vital Signs: ED Triage Vitals  Enc Vitals Group     BP 07/29/19 2100 106/67     Pulse Rate 07/29/19 2100 (!) 113     Resp 07/29/19 2100 17     Temp --      Temp src --      SpO2 07/29/19 2100 100 %     Weight 07/29/19 1949 191 lb 12.8 oz (87 kg)     Height 07/29/19 1949 4\' 10"  (1.473 m)     Head Circumference --      Peak Flow --      Pain Score 07/29/19 1949 0     Pain Loc --      Pain Edu? --      Excl. in GC? --     Constitutional: Alert and oriented x 3. Arrives with evidence of urinary insentience.  Head: Normocephalic. Atraumatic. Eyes: Conjunctivae clear. Sclera anicteric. Nose: No congestion. No rhinorrhea. Mouth/Throat: Wearing mask. Tongue bite/clench marks.  Neck: No stridor.   Cardiovascular: Tachycardic, regular rhythm. Extremities well perfused. Respiratory: Normal respiratory effort.  Lungs CTAB. Gastrointestinal: Soft. Non-tender. Non-distended.  Musculoskeletal: No lower extremity edema. No deformities. Neurologic:  Normal speech and language. No gross focal neurologic deficits are appreciated.  Skin: Skin is warm, dry and intact. No  rash noted. Psychiatric: Mood and affect are appropriate for situation.  EKG  Personally reviewed.   Rate: 146 Rhythm: sinus Axis: rightward Intervals: WNL No STEMI    Radiology  CT: IMPRESSION:  No acute finding. No specific cause of seizure identified. Normal  appearance of the cerebral hemispheres. Question a degree of  cerebellar atrophy.    Procedures  Procedure(s) performed (including critical care):  Procedures   Initial Impression / Assessment and Plan / ED Course  34 y.o. female who presents to the ED for seizure activity.  Hx of seizure, intermittent adherence to her AED.  Suspect likely breakthrough seizure due to subtherapeutic AED. Also consider electrolyte abnormality, infection.   Will plan for labs, imaging to rule out any underlying abnormality that may have lowered her seizure threshold, though highest suspsion is for subtherapeutic 2/2 decreased adherence. Did discuss the importance of adhering to her Rx and need for PCP/Neurology follow up, she voices understanding of this.   Work up unremarkable - no UTI, negative pregnancy, electrolytes w/o actionable derangements. HR improved with fluids and rest. No further seizure activity. Will DC with Rx for her Keppra and information for follow up. Given return precautions.      Final Clinical Impression(s) / ED Diagnosis  Final diagnoses:  Seizure [facility])       Note:  This document was prepared using Dragon voice recognition software and may include unintentional dictation errors.   Lilia Pro., MD 07/30/19 Massie Kluver    Lilia Pro., MD 07/30/19 [phone]

## 2019-07-29 NOTE — Discharge Instructions (Addendum)
Thank you for letting us take care of you in the emergency department today.   Please continue to take any regular, prescribed medications. We have written you your prescription for your seizure medication.   Please follow up with: - A primary care doctor to review your ER visit and follow up on your symptoms.  - Neurology (seizure doctor) to establish care and follow up.  Please return to the ER for any new or worsening symptoms.

## 2019-07-30 MED ORDER — LEVETIRACETAM 500 MG PO TABS: 500.0000 mg | ORAL_TABLET | Freq: Two times a day (BID) | ORAL | 0 refills | Status: DC

## 2019-07-30 MED ORDER — ACETAMINOPHEN 325 MG PO TABS
650.0000 mg | ORAL_TABLET | Freq: Once | ORAL | Status: AC
  Administered 2019-07-30: 650 mg via ORAL
  Filled 2019-07-30: qty 2

## 2019-08-24 ENCOUNTER — Telehealth: Payer: Self-pay

## 2019-08-24 NOTE — Telephone Encounter (Signed)
Tried to respond to VM on 08/24/19 at 9:40, no VM box

## 2019-11-29 ENCOUNTER — Other Ambulatory Visit: Payer: Self-pay

## 2019-11-29 ENCOUNTER — Emergency Department
Admission: EM | Admit: 2019-11-29 | Discharge: 2019-11-29 | Disposition: A | Discharge status: Home / Self Care | Payer: Self-pay | Attending: Emergency Medicine | Admitting: Emergency Medicine

## 2019-11-29 DIAGNOSIS — Z79899 Other long term (current) drug therapy: Secondary | ICD-10-CM | POA: Insufficient documentation

## 2019-11-29 DIAGNOSIS — N3000 Acute cystitis without hematuria: Secondary | ICD-10-CM | POA: Insufficient documentation

## 2019-11-29 DIAGNOSIS — R569 Unspecified convulsions: Secondary | ICD-10-CM | POA: Insufficient documentation

## 2019-11-29 LAB — CBC WITH DIFFERENTIAL/PLATELET
Abs Immature Granulocytes: 0.1 10*3/uL — ABNORMAL HIGH (ref 0.00–0.07)
Basophils Absolute: 0 10*3/uL (ref 0.0–0.1)
Basophils Relative: 0 %
Eosinophils Absolute: 0 10*3/uL (ref 0.0–0.5)
Eosinophils Relative: 0 %
HCT: 31.4 % — ABNORMAL LOW (ref 36.0–46.0)
Hemoglobin: 10.5 g/dL — ABNORMAL LOW (ref 12.0–15.0)
Immature Granulocytes: 1 %
Lymphocytes Relative: 8 %
Lymphs Abs: 1.3 10*3/uL (ref 0.7–4.0)
MCH: 26.6 pg (ref 26.0–34.0)
MCHC: 33.4 g/dL (ref 30.0–36.0)
MCV: 79.5 fL — ABNORMAL LOW (ref 80.0–100.0)
Monocytes Absolute: 0.7 10*3/uL (ref 0.1–1.0)
Monocytes Relative: 4 %
Neutro Abs: 15.4 10*3/uL — ABNORMAL HIGH (ref 1.7–7.7)
Neutrophils Relative %: 87 %
Platelets: 340 10*3/uL (ref 150–400)
RBC: 3.95 MIL/uL (ref 3.87–5.11)
RDW: 13.2 % (ref 11.5–15.5)
WBC: 17.5 10*3/uL — ABNORMAL HIGH (ref 4.0–10.5)
nRBC: 0 % (ref 0.0–0.2)

## 2019-11-29 LAB — COMPREHENSIVE METABOLIC PANEL
ALT: 26 U/L (ref 0–44)
AST: 39 U/L (ref 15–41)
Albumin: 4.2 g/dL (ref 3.5–5.0)
Alkaline Phosphatase: 86 U/L (ref 38–126)
Anion gap: 9 (ref 5–15)
BUN: 12 mg/dL (ref 6–20)
CO2: 21 mmol/L — ABNORMAL LOW (ref 22–32)
Calcium: 8.5 mg/dL — ABNORMAL LOW (ref 8.9–10.3)
Chloride: 106 mmol/L (ref 98–111)
Creatinine, Ser: 0.63 mg/dL (ref 0.44–1.00)
GFR calc Af Amer: >60 mL/min (ref >60)
GFR calc non Af Amer: >60 mL/min (ref >60)
Glucose, Bld: 124 mg/dL — ABNORMAL HIGH (ref 70–99)
Potassium: 3.8 mmol/L (ref 3.5–5.1)
Sodium: 136 mmol/L (ref 135–145)
Total Bilirubin: 0.7 mg/dL (ref 0.3–1.2)
Total Protein: 7.9 g/dL (ref 6.5–8.1)

## 2019-11-29 LAB — URINE DRUG SCREEN, QUALITATIVE (ARMC ONLY)
Amphetamines, Ur Screen: NOT DETECTED
Barbiturates, Ur Screen: NOT DETECTED
Benzodiazepine, Ur Scrn: POSITIVE — AB
Cannabinoid 50 Ng, Ur ~~LOC~~: NOT DETECTED
Cocaine Metabolite,Ur ~~LOC~~: NOT DETECTED
MDMA (Ecstasy)Ur Screen: NOT DETECTED
Methadone Scn, Ur: NOT DETECTED
Opiate, Ur Screen: NOT DETECTED
Phencyclidine (PCP) Ur S: NOT DETECTED
Tricyclic, Ur Screen: NOT DETECTED

## 2019-11-29 LAB — URINALYSIS, COMPLETE (UACMP) WITH MICROSCOPIC
Bilirubin Urine: NEGATIVE
Glucose, UA: NEGATIVE mg/dL
Ketones, ur: NEGATIVE mg/dL
Leukocytes,Ua: NEGATIVE
Nitrite: POSITIVE — AB
Protein, ur: NEGATIVE mg/dL
Specific Gravity, Urine: 1.018 (ref 1.005–1.030)
pH: 5 (ref 5.0–8.0)

## 2019-11-29 LAB — PREGNANCY, URINE: Preg Test, Ur: NEGATIVE

## 2019-11-29 MED ORDER — ACETAMINOPHEN 500 MG PO TABS
1000.0000 mg | ORAL_TABLET | Freq: Once | ORAL | Status: AC
  Administered 2019-11-29: 1000 mg via ORAL
  Filled 2019-11-29: qty 2

## 2019-11-29 MED ORDER — LEVETIRACETAM IN NACL 1000 MG/100ML IV SOLN
1000.0000 mg | Freq: Once | INTRAVENOUS | Status: AC
  Administered 2019-11-29: 1000 mg via INTRAVENOUS
  Filled 2019-11-29: qty 100

## 2019-11-29 MED ORDER — LEVETIRACETAM 500 MG PO TABS
500.0000 mg | ORAL_TABLET | Freq: Two times a day (BID) | ORAL | 1 refills | Status: DC
Start: 2019-11-29 — End: 2020-01-28

## 2019-11-29 MED ORDER — KETOROLAC TROMETHAMINE 30 MG/ML IJ SOLN
30.0000 mg | Freq: Once | INTRAMUSCULAR | Status: AC
  Administered 2019-11-29: 30 mg via INTRAVENOUS
  Filled 2019-11-29: qty 1

## 2019-11-29 MED ORDER — SODIUM CHLORIDE 0.9 % IV BOLUS
1000.0000 mL | Freq: Once | INTRAVENOUS | Status: AC
  Administered 2019-11-29: 1000 mL via INTRAVENOUS

## 2019-11-29 MED ORDER — SULFAMETHOXAZOLE-TRIMETHOPRIM 800-160 MG PO TABS
1.0000 | ORAL_TABLET | Freq: Two times a day (BID) | ORAL | 0 refills | Status: DC
Start: 2019-11-29 — End: 2020-01-28

## 2019-11-29 NOTE — ED Notes (Signed)
Patient is alert and calm at this time. Patient is using her phone.

## 2019-11-29 NOTE — ED Notes (Signed)
Patient ambulated to and from room commode with a steady gait. 

## 2019-11-29 NOTE — ED Notes (Signed)
Family at bedside. 

## 2019-11-29 NOTE — ED Provider Notes (Signed)
[facility] Emergency Department Provider Note  ____________________________________________   First MD Initiated Contact with Patient 11/29/19 1023     (approximate)  I have reviewed the triage vital signs and the nursing notes.  History  Chief Complaint Seizures    HPI Laura Wyatt is a 34 y.o. female known hx of seizures (and hx of missed medications), who presents to the ER for seizure. Patient has had 3 seizures today. These were witnessed by a friend (who provided additional history via phone).  Her friend states she has had at least 3 seizures today, denies any falls, head injuries, or related trauma.  They typically last a few minutes before resolving.  The friend states that the patient frequently forgets to take her medication.  After one of the seizures this morning, the friend did give her a dose of her medication, but he thinks she has missed her medication for at least the last several days.  After the 2nd seizure, she was seen by EMS at home, but declined transport.   Was post ictal on EMS arrival after the 3rd seizure. Combative while post ictal and therefore received IM Versed for this (not for seizure).  Caveat: History limited as patient is postictal on arrival.  Primarily provided by EMS and friend via phone.   Past Medical Hx Past Medical History:  Diagnosis Date  . Epilepsy (HCC)     Problem List There are no problems to display for this patient.   Past Surgical Hx History reviewed. No pertinent surgical history.  Medications Prior to Admission medications   Medication Sig Start Date End Date Taking? Authorizing Provider  levETIRAcetam (KEPPRA) 500 MG tablet Take 1 tablet (500 mg total) by mouth 2 (two) times daily. 05/13/19  Yes Don Perking, [place], MD  Vitamin D, Ergocalciferol, (DRISDOL) 1.25 MG (50000 UNIT) CAPS capsule Take 50,000 Units by mouth once a week. 10/23/19  Yes [provider]     Allergies Patient has no known allergies.  Family Hx No family history on file.  Social Hx Social History   Tobacco Use  . Smoking status: Never Smoker  . Smokeless tobacco: Never Used  Substance Use Topics  . Alcohol use: Never  . Drug use: Never     Review of Systems Unable to accurately obtain, patient postictal on arrival   Physical Exam  Vital Signs: ED Triage Vitals  Enc Vitals Group     BP 11/29/19 1023 119/80     Pulse Rate 11/29/19 1023 (!) 113     Resp 11/29/19 1023 (!) 25     Temp 11/29/19 1023 97.9 F (36.6 C)     Temp Source 11/29/19 1023 Axillary     SpO2 11/29/19 1023 97 %     Weight 11/29/19 1025 165 lb (74.8 kg)     Height 11/29/19 1025 5\' 4"  (1.626 m)     Head Circumference --      Peak Flow --      Pain Score 11/29/19 1024 Asleep     Pain Loc --      Pain Edu? --      Excl. in GC? --     Constitutional: Awake, moving all extremities, but confused and presumably postictal.  No evidence of gross/overt trauma. Head: Normocephalic. Atraumatic. Eyes: Conjunctivae clear. Sclera anicteric. Pupils equal and symmetric. Nose: No masses or lesions. No congestion or rhinorrhea. Mouth/Throat: Wearing mask.  Neck: No stridor. Trachea midline.  Cardiovascular: Normal rate, regular rhythm. Extremities well perfused. Respiratory:  Normal respiratory effort.  Lungs CTAB. Gastrointestinal: Soft. Non-distended. Non-tender.  Genitourinary: Deferred.  Evidence of urinary incontinence. Musculoskeletal: No lower extremity edema. No deformities. Neurologic: Moves all extremities equally.  Presumably postictal.  No gross focal or lateralizing neurologic deficits are appreciated.  Skin: Skin is warm, dry and intact. No rash noted. Psychiatric: Mood and affect are appropriate for situation.  EKG  Personally reviewed and interpreted by myself.   Date: 11/29/19 Time: 1020 Rate: 115 Rhythm: Sinus Axis: Left Intervals: Within normal limits No  STEMI     Procedures  Procedure(s) performed (including critical care):  Procedures   Initial Impression / Assessment and Plan / MDM / ED Course  34 y.o. female who presents to the ED for several seizures today.  Known history of seizures, noncompliant with her medications. Exam is atraumatic.   Ddx: seizure in the setting of known epilepsy, likely exacerbated by noncompliance, electrolyte abnormality, arrhythmia, pregnancy.  Will plan for labs, imaging.  Will administer her antiepileptics, continue to monitor.  Labs reveal leukocytosis, suspect reactive secondary to her seizure.  Electrolytes without actionable derangements.  Do not feel her white count represents acute infection given she is otherwise asymptomatic from an infectious perspective, afebrile.  On reassessment, patient is more awake and alert, neurologically intact, returned to baseline appropriately.  Updated her on interval events and results thus far with an interpreter.  Patient does confirm she missed her medications yesterday. Awaiting urine and then anticipate discharge with outpatient follow-up.  She states she has an appointment scheduled with Neurology for next week.   _______________________________   As part of my medical decision making I have reviewed available labs, radiology tests, reviewed old records/performed chart review, obtained additional history from friend via phone    Final Clinical Impression(s) / ED Diagnosis  Seizure    Note:  This document was prepared using Dragon voice recognition software and may include unintentional dictation errors.   Lilia Pro., MD 11/29/19 [phone]

## 2019-11-29 NOTE — Discharge Instructions (Addendum)
Gracias por dejarnos atenderlo en la sala de emergencias hoy. Lo enviaremos a casa con una receta para sus medicamentos anticonvulsivos. Es Patent examiner importante que tome estos medicamentos todos [place], segn lo programado. Haga un seguimiento en su prxima cita de neurologa. Regrese al departamento de emergencias por cualquier sntoma nuevo o que empeore.  Thank you for letting us take care of you in the ER today.  We will send you home with a prescription for your seizure medications.  It is extremely important that you take these medications every single day, as scheduled.  Please follow-up at your upcoming neurology appointment.  Please return to the emergency department for any new or worsening symptoms.

## 2019-11-29 NOTE — ED Notes (Signed)
Spanish interpreter Marchelle Folks at bedside.

## 2019-11-29 NOTE — ED Triage Notes (Signed)
Pt comes via [facility] from home with c/o 3 seizures. Pt had seziure earlier and refused transport to hospital with EMS. Pt was alert at that time.  EMS called out again and pt had 2 more witnessed seizures and was combative and postictal. EMS reports pt had missed 2 days worth of medication. VSS. Pt given 4 of versed IM for combativeness.  Pt arrives in restraints. Restraints removed and pt restless with movement.

## 2019-12-01 LAB — LEVETIRACETAM LEVEL: Levetiracetam Lvl: 3.9 ug/mL — ABNORMAL LOW (ref 10.0–40.0)

## 2020-01-28 ENCOUNTER — Observation Stay
Admission: EM | Admit: 2020-01-28 | Discharge: 2020-01-29 | Disposition: A | Discharge status: Home / Self Care | Payer: Self-pay | Attending: Internal Medicine | Admitting: Internal Medicine

## 2020-01-28 ENCOUNTER — Observation Stay: Payer: Self-pay

## 2020-01-28 ENCOUNTER — Other Ambulatory Visit: Payer: Self-pay

## 2020-01-28 ENCOUNTER — Emergency Department: Payer: Self-pay

## 2020-01-28 DIAGNOSIS — S0083XA Contusion of other part of head, initial encounter: Principal | ICD-10-CM | POA: Insufficient documentation

## 2020-01-28 DIAGNOSIS — G40909 Epilepsy, unspecified, not intractable, without status epilepticus: Secondary | ICD-10-CM | POA: Insufficient documentation

## 2020-01-28 DIAGNOSIS — R7309 Other abnormal glucose: Secondary | ICD-10-CM

## 2020-01-28 DIAGNOSIS — Y939 Activity, unspecified: Secondary | ICD-10-CM | POA: Insufficient documentation

## 2020-01-28 DIAGNOSIS — Z91199 Patient's noncompliance with other medical treatment and regimen due to unspecified reason: Secondary | ICD-10-CM

## 2020-01-28 DIAGNOSIS — Y999 Unspecified external cause status: Secondary | ICD-10-CM | POA: Insufficient documentation

## 2020-01-28 DIAGNOSIS — Z9119 Patient's noncompliance with other medical treatment and regimen: Secondary | ICD-10-CM | POA: Insufficient documentation

## 2020-01-28 DIAGNOSIS — W19XXXA Unspecified fall, initial encounter: Secondary | ICD-10-CM | POA: Insufficient documentation

## 2020-01-28 DIAGNOSIS — Y929 Unspecified place or not applicable: Secondary | ICD-10-CM | POA: Insufficient documentation

## 2020-01-28 DIAGNOSIS — Z20822 Contact with and (suspected) exposure to covid-19: Secondary | ICD-10-CM | POA: Insufficient documentation

## 2020-01-28 LAB — COMPREHENSIVE METABOLIC PANEL
ALT: 54 U/L — ABNORMAL HIGH (ref 0–44)
AST: 72 U/L — ABNORMAL HIGH (ref 15–41)
Albumin: 3.9 g/dL (ref 3.5–5.0)
Alkaline Phosphatase: 84 U/L (ref 38–126)
Anion gap: 15 (ref 5–15)
BUN: 13 mg/dL (ref 6–20)
CO2: 21 mmol/L — ABNORMAL LOW (ref 22–32)
Calcium: 8.7 mg/dL — ABNORMAL LOW (ref 8.9–10.3)
Chloride: 102 mmol/L (ref 98–111)
Creatinine, Ser: 0.62 mg/dL (ref 0.44–1.00)
GFR calc Af Amer: >60 mL/min (ref >60)
GFR calc non Af Amer: >60 mL/min (ref >60)
Glucose, Bld: 125 mg/dL — ABNORMAL HIGH (ref 70–99)
Potassium: 3.5 mmol/L (ref 3.5–5.1)
Sodium: 138 mmol/L (ref 135–145)
Total Bilirubin: 0.8 mg/dL (ref 0.3–1.2)
Total Protein: 7.5 g/dL (ref 6.5–8.1)

## 2020-01-28 LAB — CBC WITH DIFFERENTIAL/PLATELET
Abs Immature Granulocytes: 0.06 10*3/uL (ref 0.00–0.07)
Basophils Absolute: 0.1 10*3/uL (ref 0.0–0.1)
Basophils Relative: 1 %
Eosinophils Absolute: 0.2 10*3/uL (ref 0.0–0.5)
Eosinophils Relative: 2 %
HCT: 30.4 % — ABNORMAL LOW (ref 36.0–46.0)
Hemoglobin: 11.3 g/dL — ABNORMAL LOW (ref 12.0–15.0)
Immature Granulocytes: 1 %
Lymphocytes Relative: 33 %
Lymphs Abs: 3 10*3/uL (ref 0.7–4.0)
MCH: 29.6 pg (ref 26.0–34.0)
MCHC: 37.2 g/dL — ABNORMAL HIGH (ref 30.0–36.0)
MCV: 79.6 fL — ABNORMAL LOW (ref 80.0–100.0)
Monocytes Absolute: 0.7 10*3/uL (ref 0.1–1.0)
Monocytes Relative: 8 %
Neutro Abs: 5 10*3/uL (ref 1.7–7.7)
Neutrophils Relative %: 55 %
Platelets: 269 10*3/uL (ref 150–400)
RBC: 3.82 MIL/uL — ABNORMAL LOW (ref 3.87–5.11)
RDW: 15.7 % — ABNORMAL HIGH (ref 11.5–15.5)
WBC: 9 10*3/uL (ref 4.0–10.5)
nRBC: 0 % (ref 0.0–0.2)

## 2020-01-28 LAB — HEMOGLOBIN A1C
Hgb A1c MFr Bld: 5.4 % (ref 4.8–5.6)
Mean Plasma Glucose: 108.28 mg/dL

## 2020-01-28 LAB — FOLATE: Folate: 16.8 ng/mL (ref >5.9)

## 2020-01-28 LAB — IRON AND TIBC
Iron: 49 ug/dL (ref 28–170)
Saturation Ratios: 13 % (ref 10.4–31.8)
TIBC: 386 ug/dL (ref 250–450)
UIBC: 337 ug/dL

## 2020-01-28 LAB — FERRITIN: Ferritin: 42 ng/mL (ref 11–307)

## 2020-01-28 LAB — HIV ANTIBODY (ROUTINE TESTING W REFLEX): HIV Screen 4th Generation wRfx: NONREACTIVE

## 2020-01-28 LAB — TSH: TSH: 3.493 u[IU]/mL (ref 0.350–4.500)

## 2020-01-28 LAB — MAGNESIUM: Magnesium: 2.3 mg/dL (ref 1.7–2.4)

## 2020-01-28 LAB — VITAMIN B12: Vitamin B-12: 959 pg/mL — ABNORMAL HIGH (ref 180–914)

## 2020-01-28 LAB — RETICULOCYTES
Immature Retic Fract: 7.3 % (ref 2.3–15.9)
RBC.: 3.77 MIL/uL — ABNORMAL LOW (ref 3.87–5.11)
Retic Count, Absolute: 54.3 10*3/uL (ref 19.0–186.0)
Retic Ct Pct: 1.4 % (ref 0.4–3.1)

## 2020-01-28 LAB — T4, FREE: Free T4: 0.82 ng/dL (ref 0.61–1.12)

## 2020-01-28 LAB — GLUCOSE, CAPILLARY: Glucose-Capillary: 90 mg/dL (ref 70–99)

## 2020-01-28 LAB — SARS CORONAVIRUS 2 BY RT PCR (HOSPITAL ORDER, PERFORMED IN ~~LOC~~ HOSPITAL LAB): SARS Coronavirus 2: NEGATIVE

## 2020-01-28 LAB — ETHANOL: Alcohol, Ethyl (B): <10 mg/dL (ref <10)

## 2020-01-28 MED ORDER — GADOBUTROL 1 MMOL/ML IV SOLN
9.0000 mL | Freq: Once | INTRAVENOUS | Status: AC | PRN
  Administered 2020-01-28: 9 mL via INTRAVENOUS

## 2020-01-28 MED ORDER — LORAZEPAM 2 MG/ML IJ SOLN
2.0000 mg | Freq: Once | INTRAMUSCULAR | Status: AC
  Administered 2020-01-28: 2 mg via INTRAVENOUS

## 2020-01-28 MED ORDER — LORAZEPAM 2 MG/ML IJ SOLN
INTRAMUSCULAR | Status: AC
  Filled 2020-01-28: qty 1

## 2020-01-28 MED ORDER — LEVETIRACETAM IN NACL 500 MG/100ML IV SOLN
500.0000 mg | Freq: Once | INTRAVENOUS | Status: DC
  Filled 2020-01-28: qty 100

## 2020-01-28 MED ORDER — ENOXAPARIN SODIUM 40 MG/0.4ML ~~LOC~~ SOLN
40.0000 mg | SUBCUTANEOUS | Status: DC
  Administered 2020-01-28: 40 mg via SUBCUTANEOUS
  Filled 2020-01-28: qty 0.4

## 2020-01-28 MED ORDER — SODIUM CHLORIDE 0.9 % IV BOLUS
1000.0000 mL | Freq: Once | INTRAVENOUS | Status: AC
  Administered 2020-01-28: 1000 mL via INTRAVENOUS

## 2020-01-28 MED ORDER — MAGNESIUM SULFATE IN D5W 1-5 GM/100ML-% IV SOLN
1.0000 g | Freq: Once | INTRAVENOUS | Status: DC
  Filled 2020-01-28: qty 100

## 2020-01-28 MED ORDER — ONDANSETRON HCL 4 MG/2ML IJ SOLN: 4.0000 mg | Freq: Four times a day (QID) | INTRAMUSCULAR | Status: DC | PRN

## 2020-01-28 MED ORDER — LEVETIRACETAM IN NACL 1000 MG/100ML IV SOLN
1000.0000 mg | Freq: Once | INTRAVENOUS | Status: AC
  Administered 2020-01-28: 1000 mg via INTRAVENOUS
  Filled 2020-01-28: qty 100

## 2020-01-28 MED ORDER — LORAZEPAM 2 MG/ML IJ SOLN: 1.0000 mg | INTRAMUSCULAR | Status: DC | PRN

## 2020-01-28 MED ORDER — SODIUM CHLORIDE 0.9 % IV SOLN
75.0000 mL/h | INTRAVENOUS | Status: DC
  Administered 2020-01-28 (×2): 75 mL/h via INTRAVENOUS

## 2020-01-28 MED ORDER — ONDANSETRON HCL 4 MG PO TABS: 4.0000 mg | ORAL_TABLET | Freq: Four times a day (QID) | ORAL | Status: DC | PRN

## 2020-01-28 MED ORDER — LEVETIRACETAM IN NACL 500 MG/100ML IV SOLN
500.0000 mg | Freq: Two times a day (BID) | INTRAVENOUS | Status: DC
  Administered 2020-01-28 – 2020-01-29 (×3): 500 mg via INTRAVENOUS
  Filled 2020-01-28 (×5): qty 100

## 2020-01-28 MED ORDER — LORAZEPAM 2 MG/ML IJ SOLN: 2.0000 mg | Freq: Once | INTRAMUSCULAR | Status: DC

## 2020-01-28 NOTE — ED Notes (Signed)
Pt transported to CT ?

## 2020-01-28 NOTE — ED Triage Notes (Signed)
Pt arrives from home via [facility], removed from home by [employer].  She has been having tonic clonic seizures, hit her head on unknown object during one seizure, non compliant with medication according to family members at home.  Upon arrival pt is alert, large bump on forehead.

## 2020-01-28 NOTE — ED Provider Notes (Signed)
[facility] Emergency Department Provider Note   ____________________________________________   First MD Initiated Contact with Patient 01/28/20 0200     (approximate)  I have reviewed the triage vital signs and the nursing notes.   HISTORY  Chief Complaint Seizure  Level of V caveat: Limited by postictal state   HPI Laura Wyatt is a 34 y.o. female brought to the ED from home via EMS status post tonic-clonic seizure, witnessed by family.  Per son, patient has seizure disorder but is noncompliant with her medications.  Reports tonic-clonic seizure activity followed by falling to the floor.  Presents in a postictal state with forehead hematoma.  Patient was reportedly combative with EMS who gave her 2 mg IM Versed.  Rest of history is limited secondary to postictal state.      Past Medical History:  Diagnosis Date  . Epilepsy [facility])     Patient Active Problem List   Diagnosis Date Noted  . Recurrent seizures (HCC) 01/28/2020  . Epilepsy (HCC) 01/28/2020  . Non-compliance with treatment 01/28/2020    History reviewed. No pertinent surgical history.  Prior to Admission medications   Medication Sig Start Date End Date Taking? Authorizing Provider  levETIRAcetam (KEPPRA) 1000 MG tablet Take 1,000 mg by mouth 2 (two) times daily. 12/26/19  Yes [provider]    Allergies Patient has no known allergies.  History reviewed. No pertinent family history.  Social History Social History   Tobacco Use  . Smoking status: Never Smoker  . Smokeless tobacco: Never Used  Substance Use Topics  . Alcohol use: Never  . Drug use: Never    Review of Systems  Constitutional: No fever/chills Eyes: No visual changes. ENT: No sore throat. Cardiovascular: Denies chest pain. Respiratory: Denies shortness of breath. Gastrointestinal: No abdominal pain.  No nausea, no vomiting.  No diarrhea.  No constipation. Genitourinary: Negative for  dysuria. Musculoskeletal: Negative for back pain. Skin: Negative for rash. Neurological: Positive for seizure.  Negative for headaches, focal weakness or numbness.   ____________________________________________   PHYSICAL EXAM:  VITAL SIGNS: ED Triage Vitals  Enc Vitals Group     BP      Pulse      Resp      Temp      Temp src      SpO2      Weight      Height      Head Circumference      Peak Flow      Pain Score      Pain Loc      Pain Edu?      Excl. in GC?     Constitutional: Postictal.  Somnolent appearing and in no acute distress. Eyes: Conjunctivae are normal. PERRL. EOMI. Head: Moderately sized right forehead hematoma. Nose: Atraumatic. Mouth/Throat: Mucous membranes are moist.  No dental malocclusion.  Did not bite tongue.   Neck: No stridor.  No cervical spine tenderness to palpation.  No step-offs or deformities noted. Cardiovascular: Normal rate, regular rhythm. Grossly normal heart sounds.  Good peripheral circulation. Respiratory: Normal respiratory effort.  No retractions. Lungs CTAB. Gastrointestinal: Soft and nontender. No distention. No abdominal bruits. No CVA tenderness. Genitourinary: No urinary incontinence noted. Musculoskeletal: No lower extremity tenderness nor edema.  No joint effusions. Neurologic: Postictal state.  MAEx4 to painful stimuli.   Skin:  Skin is warm, dry and intact. No rash noted.  No petechiae. Psychiatric: Unable to assess.  ____________________________________________   LABS (all labs  ordered are listed, but only abnormal results are displayed)  Labs Reviewed  CBC WITH DIFFERENTIAL/PLATELET - Abnormal; Notable for the following components:      Result Value   RBC 3.82 (*)    Hemoglobin 11.3 (*)    HCT 30.4 (*)    MCV 79.6 (*)    MCHC 37.2 (*)    RDW 15.7 (*)    All other components within normal limits  COMPREHENSIVE METABOLIC PANEL - Abnormal; Notable for the following components:   CO2 21 (*)    Glucose, Bld  125 (*)    Calcium 8.7 (*)    AST 72 (*)    ALT 54 (*)    All other components within normal limits  SARS CORONAVIRUS 2 BY RT PCR (HOSPITAL ORDER, PERFORMED IN [facility])  ETHANOL  GLUCOSE, CAPILLARY  HIV ANTIBODY (ROUTINE TESTING W REFLEX)   ____________________________________________  EKG  ED ECG REPORT I, Zoltan Genest J, the attending physician, personally viewed and interpreted this ECG.   Date: 01/28/2020  EKG Time: 0204  Rate: 105  Rhythm: sinus tachycardia  Axis: Normal  Intervals:none  ST&T Change: Nonspecific  ____________________________________________  RADIOLOGY  ED MD interpretation: No ICH, no acute cardiopulmonary process  Official radiology report(s): CT Head Wo Contrast  Result Date: 01/28/2020 CLINICAL DATA:  Seizure EXAM: CT HEAD WITHOUT CONTRAST TECHNIQUE: Contiguous axial images were obtained from the base of the skull through the vertex without intravenous contrast. COMPARISON:  07/29/2019 FINDINGS: Brain: There is moderate atrophy of the cerebellum, more than would be typically expected for a patient of this age. This appears similar to prior examination. No abnormal intra or extra-axial mass lesion or fluid collection. No abnormal mass effect or midline shift. No evidence of acute intracranial hemorrhage or infarct. Ventricular size is normal. Cerebellum unremarkable. Vascular: Unremarkable Skull: Intact Sinuses/Orbits: Paranasal sinuses are clear. Orbits are unremarkable. Other: Mastoid air cells and middle ear cavities are clear. Moderate right frontal scalp hematoma. IMPRESSION: 1. Moderate right frontal scalp hematoma. No acute intracranial abnormality. 2. Moderate atrophy of the cerebellum, more than would be typically expected for a patient of this age. This appears similar to prior examination. Electronically Signed   By: Helyn Numbers MD   On: 01/28/2020 02:47   DG Chest Port 1 View  Result Date: 01/28/2020 CLINICAL DATA:  Seizure  EXAM: PORTABLE CHEST 1 VIEW COMPARISON:  None. FINDINGS: The heart size and mediastinal contours are within normal limits. Both lungs are clear. The visualized skeletal structures are unremarkable. IMPRESSION: No active disease. Electronically Signed   By: Deatra Robinson M.D.   On: 01/28/2020 03:15    ____________________________________________   PROCEDURES  Procedure(s) performed (including Critical Care):  .1-3 Lead EKG Interpretation Performed by: Irean Hong, MD Authorized by: Irean Hong, MD     Interpretation: abnormal     ECG rate:  103   ECG rate assessment: tachycardic     Rhythm: sinus tachycardia     Ectopy: none     Conduction: normal   Comments:     Patient placed on cardiac monitor to evaluate for arrhythmias    CRITICAL CARE Performed by: Irean Hong   Total critical care time: 30 minutes  Critical care time was exclusive of separately billable procedures and treating other patients.  Critical care was necessary to treat or prevent imminent or life-threatening deterioration.  Critical care was time spent personally by me on the following activities: development of treatment plan with patient and/or surrogate  as well as nursing, discussions with consultants, evaluation of patient's response to treatment, examination of patient, obtaining history from patient or surrogate, ordering and performing treatments and interventions, ordering and review of laboratory studies, ordering and review of radiographic studies, pulse oximetry and re-evaluation of patient's condition.  ____________________________________________   INITIAL IMPRESSION / ASSESSMENT AND PLAN / ED COURSE  As part of my medical decision making, I reviewed the following data within the electronic MEDICAL RECORD NUMBER Nursing notes reviewed and incorporated, Labs reviewed, EKG interpreted, Old chart reviewed, Radiograph reviewed and Notes from prior ED visits     Laura Wyatt was evaluated in  Emergency Department on 01/28/2020 for the symptoms described in the history of present illness. She was evaluated in the context of the global COVID-19 pandemic, which necessitated consideration that the patient might be at risk for infection with the SARS-CoV-2 virus that causes COVID-19. Institutional protocols and algorithms that pertain to the evaluation of patients at risk for COVID-19 are in a state of rapid change based on information released by regulatory bodies including the CDC and federal and state organizations. These policies and algorithms were followed during the patient's care in the ED.    34 year old female with epilepsy and history of noncompliance presenting with seizure and forehead hematoma.  Differential diagnosis includes but is not limited to subtherapeutic medication levels, ICH, metabolic, infectious etiologies, etc.  I personally reviewed patient's chart and see her most recent visit with [facility] neurology on 12/19/2019 where they increased her Keppra to 100 mg bid and prescribed Ativan 1 mg as rescue medication after GTC.  Will obtain basic lab work, CT head, load 1 g IV Keppra and observe in the emergency department.   Clinical Course as of Jan 27 513  Wynelle Link Jan 28, 2020  0401 Patient sleeping soundly in no acute distress. Labs and CT scan unremarkable. IV fluids infusing. Family member at bedside. We will continue to monitor.   [JS]  0434 Witnessed tonic-clonic seizure.  2 mg IV Ativan given.  Son at bedside who tells me patient had 2 seizures at home prior to arrival.  This makes patient's third seizure of the night.  She has had Keppra loaded.  Will discuss with hospitalist for admission.   [JS]    Clinical Course User Index [JS] Irean Hong, MD     ____________________________________________   FINAL CLINICAL IMPRESSION(S) / ED DIAGNOSES  Final diagnoses:  Seizure disorder [facility])  Recurrent seizures [facility])     ED Discharge Orders    None       Note:   This document was prepared using Dragon voice recognition software and may include unintentional dictation errors.   Irean Hong, MD 01/28/20 [phone]

## 2020-01-28 NOTE — H&P (Signed)
History and Physical    Laura Wyatt EHO:[id number] DOB: [birth year] DOA: 01/28/2020  PCP: Patient, No Pcp Per   Patient coming from: home  I have personally briefly reviewed patient's old medical records in [facility]  Chief Complaint: Recurrent seizures  HPI: Laura Wyatt is a 34 y.o. female with medical history significant for epilepsy, followed at [facility] neurology, reportedly noncompliant with medication who was brought to the emergency room after she had 2 seizures at home, tonic-clonic, falling onto her face sustaining a hematoma to the forehead.  On arrival to the emergency room she was postictal and went on to have another seizure lasting 2 minutes aborted with Ativan ED Course: Patient was tachycardic and tachypneic on arrival but with otherwise unremarkable vitals blood work significant for mild elevation in LFTs, hemoglobin 11.3.  EtOH level less than 10.  CT head showed right frontal scalp hematoma.  Patient was loaded with Keppra 1 g in the emergency room.  Hospitalist consulted for admission.  Review of Systems: Unable to obtain due to altered mental status  Past Medical History:  Diagnosis Date  . Epilepsy (HCC)     History reviewed. No pertinent surgical history.   reports that she has never smoked. She has never used smokeless tobacco. She reports that she does not drink alcohol and does not use drugs.  No Known Allergies  Family history: Unable to obtain due to altered mental status   Prior to Admission medications   Medication Sig Start Date End Date Taking? Authorizing Provider  levETIRAcetam (KEPPRA) 500 MG tablet Take 1 tablet (500 mg total) by mouth 2 (two) times daily. 11/29/19   Willy Eddy, MD  sulfamethoxazole-trimethoprim (BACTRIM DS) 800-160 MG tablet Take 1 tablet by mouth 2 (two) times daily. 11/29/19   Emily Filbert, MD  Vitamin D, Ergocalciferol, (DRISDOL) 1.25 MG (50000 UNIT) CAPS capsule Take 50,000 Units by mouth once a  week. 10/23/19   [provider]    Physical Exam: Vitals:   01/28/20 0255 01/28/20 0330 01/28/20 0400 01/28/20 0440  BP:    125/69  Pulse:  94 94 (!) 130  Resp:  18 19 (!) 25  Temp:      TempSrc:      SpO2:  99% 97% 98%  Weight: 65.8 kg     Height: 5' (1.524 m)        Vitals:   01/28/20 0255 01/28/20 0330 01/28/20 0400 01/28/20 0440  BP:    125/69  Pulse:  94 94 (!) 130  Resp:  18 19 (!) 25  Temp:      TempSrc:      SpO2:  99% 97% 98%  Weight: 65.8 kg     Height: 5' (1.524 m)         Constitutional:  Somnolent.  Postictal HEENT:      Head:  Frontal hematoma        Eyes: PERLA, EOMI, Conjunctivae are normal. Sclera is non-icteric.       Mouth/Throat: Mucous membranes are moist.       Neck: Supple with no signs of meningismus. Cardiovascular: Regular rate and rhythm. No murmurs, gallops, or rubs. 2+ symmetrical distal pulses are present . No JVD. No LE edema Respiratory: Respiratory effort normal .Lungs sounds clear bilaterally. No wheezes, crackles, or rhonchi.  Gastrointestinal: Soft, non tender, and non distended with positive bowel sounds. No rebound or guarding. Musculoskeletal: Nontender with normal range of motion in all extremities. No cyanosis, or erythema of extremities. Neurologic:  Face is symmetric. Moving all extremities. No gross focal neurologic deficits . Skin: Skin is warm, dry.  No rash or ulcers    Labs on Admission: I have personally reviewed following labs and imaging studies  CBC: Recent Labs  Lab 01/28/20 0205  WBC 9.0  NEUTROABS 5.0  HGB 11.3*  HCT 30.4*  MCV 79.6*  PLT 269   Basic Metabolic Panel: Recent Labs  Lab 01/28/20 0205  NA 138  K 3.5  CL 102  CO2 21*  GLUCOSE 125*  BUN 13  CREATININE 0.62  CALCIUM 8.7*   GFR: Estimated Creatinine Clearance: 85.4 mL/min (by C-G formula based on SCr of 0.62 mg/dL). Liver Function Tests: Recent Labs  Lab 01/28/20 0205  AST 72*  ALT 54*  ALKPHOS 84  BILITOT 0.8    PROT 7.5  ALBUMIN 3.9   No results for input(s): LIPASE, AMYLASE in the last 168 hours. No results for input(s): AMMONIA in the last 168 hours. Coagulation Profile: No results for input(s): INR, PROTIME in the last 168 hours. Cardiac Enzymes: No results for input(s): CKTOTAL, CKMB, CKMBINDEX, TROPONINI in the last 168 hours. BNP (last 3 results) No results for input(s): PROBNP in the last 8760 hours. HbA1C: No results for input(s): HGBA1C in the last 72 hours. CBG: Recent Labs  Lab 01/28/20 0203  GLUCAP 90   Lipid Profile: No results for input(s): CHOL, HDL, LDLCALC, TRIG, CHOLHDL, LDLDIRECT in the last 72 hours. Thyroid Function Tests: No results for input(s): TSH, T4TOTAL, FREET4, T3FREE, THYROIDAB in the last 72 hours. Anemia Panel: No results for input(s): VITAMINB12, FOLATE, FERRITIN, TIBC, IRON, RETICCTPCT in the last 72 hours. Urine analysis:    Component Value Date/Time   COLORURINE YELLOW (A) 11/29/2019 1527   APPEARANCEUR CLOUDY (A) 11/29/2019 1527   LABSPEC 1.018 11/29/2019 1527   PHURINE 5.0 11/29/2019 1527   GLUCOSEU NEGATIVE 11/29/2019 1527   HGBUR SMALL (A) 11/29/2019 1527   BILIRUBINUR NEGATIVE 11/29/2019 1527   KETONESUR NEGATIVE 11/29/2019 1527   PROTEINUR NEGATIVE 11/29/2019 1527   NITRITE POSITIVE (A) 11/29/2019 1527   LEUKOCYTESUR NEGATIVE 11/29/2019 1527    Radiological Exams on Admission: CT Head Wo Contrast  Result Date: 01/28/2020 CLINICAL DATA:  Seizure EXAM: CT HEAD WITHOUT CONTRAST TECHNIQUE: Contiguous axial images were obtained from the base of the skull through the vertex without intravenous contrast. COMPARISON:  07/29/2019 FINDINGS: Brain: There is moderate atrophy of the cerebellum, more than would be typically expected for a patient of this age. This appears similar to prior examination. No abnormal intra or extra-axial mass lesion or fluid collection. No abnormal mass effect or midline shift. No evidence of acute intracranial  hemorrhage or infarct. Ventricular size is normal. Cerebellum unremarkable. Vascular: Unremarkable Skull: Intact Sinuses/Orbits: Paranasal sinuses are clear. Orbits are unremarkable. Other: Mastoid air cells and middle ear cavities are clear. Moderate right frontal scalp hematoma. IMPRESSION: 1. Moderate right frontal scalp hematoma. No acute intracranial abnormality. 2. Moderate atrophy of the cerebellum, more than would be typically expected for a patient of this age. This appears similar to prior examination. Electronically Signed   By: Helyn Numbers MD   On: 01/28/2020 02:47   DG Chest Port 1 View  Result Date: 01/28/2020 CLINICAL DATA:  Seizure EXAM: PORTABLE CHEST 1 VIEW COMPARISON:  None. FINDINGS: The heart size and mediastinal contours are within normal limits. Both lungs are clear. The visualized skeletal structures are unremarkable. IMPRESSION: No active disease. Electronically Signed   By: Chrisandra Netters.D.  On: 01/28/2020 03:15    Assessment/Plan 34 year old female with a history of epilepsy and reported noncompliance with seizure medicine presenting with recurrent seizures    Recurrent seizures (HCC)   Epilepsy (HCC)   Non-compliance with treatment -Patient loaded with 1 g Keppra in the emergency room -Continue Keppra 500 twice daily -Patient follows at [facility] -Neurology consult  Hematoma frontal scalp -Cool compresses and pain control -No acute injury on CT head    DVT prophylaxis: Lovenox  Code Status: full code  Family Communication:  none  Disposition Plan: Back to previous home environment Consults called: Neurology Status: Observation    Andris Baumann MD [facility]     01/28/2020, 5:05 AM

## 2020-01-28 NOTE — ED Notes (Signed)
Pt cleansed of incontinent urine, head of bed elevated to 45 degrees. Pt is postictal.

## 2020-01-28 NOTE — Consult Note (Signed)
Reason for Consult: seizures Requesting Physician: Dr. Allena Katz   CC: seizure d/o   HPI: Laura Wyatt is an 34 y.o. female  with medical history significant for epilepsy, followed at [facility] neurology, reportedly noncompliant with medication who was brought to the emergency room after she had 2 seizures at home, tonic-clonic, falling onto her face sustaining a hematoma to the forehead.  On arrival to the emergency room she was postictal and went on to have another seizure lasting 2 minutes aborted with Ativan  CT head showed right frontal scalp hematoma.  Patient was loaded with Keppra 1 g in the emergency room.  Now much improved and follows commands. Admits to periods of non compliance with AED.    Past Medical History:  Diagnosis Date  . Epilepsy (HCC)     History reviewed. No pertinent surgical history.  History reviewed. No pertinent family history.  Social History:  reports that she has never smoked. She has never used smokeless tobacco. She reports that she does not drink alcohol and does not use drugs.  No Known Allergies  Medications: I have reviewed the patient's current medications.  ROS: History obtained from the patient  General ROS: negative for - chills, fatigue, fever, night sweats, weight gain or weight loss Psychological ROS: negative for - behavioral disorder, hallucinations, memory difficulties, mood swings or suicidal ideation Ophthalmic ROS: negative for - blurry vision, double vision, eye pain or loss of vision ENT ROS: negative for - epistaxis, nasal discharge, oral lesions, sore throat, tinnitus or vertigo Allergy and Immunology ROS: negative for - hives or itchy/watery eyes Hematological and Lymphatic ROS: negative for - bleeding problems, bruising or swollen lymph nodes Endocrine ROS: negative for - galactorrhea, hair pattern changes, polydipsia/polyuria or temperature intolerance Respiratory ROS: negative for - cough, hemoptysis, shortness of breath or  wheezing Cardiovascular ROS: negative for - chest pain, dyspnea on exertion, edema or irregular heartbeat Gastrointestinal ROS: negative for - abdominal pain, diarrhea, hematemesis, nausea/vomiting or stool incontinence Genito-Urinary ROS: negative for - dysuria, hematuria, incontinence or urinary frequency/urgency Musculoskeletal ROS: negative for - joint swelling or muscular weakness Neurological ROS: as noted in HPI Dermatological ROS: negative for rash and skin lesion changes  Physical Examination: Blood pressure 122/67, pulse (!) 107, temperature 97.9 F (36.6 C), temperature source Oral, resp. rate 17, height 5' (1.524 m), weight 88.6 kg, last menstrual period 01/25/2020, SpO2 98 %.   Neurological Examination   Mental Status: Alert, oriented, thought content appropriate.  Speech fluent without evidence of aphasia.  Able to follow 3 step commands without difficulty. Cranial Nerves: II: Visual fields grossly normal, pupils equal, round, reactive to light and accommodation III,IV, VI: ptosis not present, extra-ocular motions intact bilaterally V,VII: smile symmetric, facial light touch sensation normal bilaterally VIII: hearing normal bilaterally XI: bilateral shoulder shrug XII: midline tongue extension Motor: Right : Upper extremity   5/5    Left:     Upper extremity   5/5  Lower extremity   5/5     Lower extremity   5/5 Tone and bulk:normal tone throughout; no atrophy noted Sensory: Pinprick and light touch intact throughout, bilaterally Deep Tendon Reflexes: 2+ and symmetric throughout Plantars: Right: downgoing   Left: downgoing Cerebellar: normal finger-to-nose, normal rapid alternating movements and normal heel-to-shin test Gait: not tested      Laboratory Studies:   Basic Metabolic Panel: Recent Labs  Lab 01/28/20 0205  NA 138  K 3.5  CL 102  CO2 21*  GLUCOSE 125*  BUN 13  CREATININE 0.62  CALCIUM 8.7*    Liver Function Tests: Recent Labs  Lab  01/28/20 0205  AST 72*  ALT 54*  ALKPHOS 84  BILITOT 0.8  PROT 7.5  ALBUMIN 3.9   No results for input(s): LIPASE, AMYLASE in the last 168 hours. No results for input(s): AMMONIA in the last 168 hours.  CBC: Recent Labs  Lab 01/28/20 0205  WBC 9.0  NEUTROABS 5.0  HGB 11.3*  HCT 30.4*  MCV 79.6*  PLT 269    Cardiac Enzymes: No results for input(s): CKTOTAL, CKMB, CKMBINDEX, TROPONINI in the last 168 hours.  BNP: Invalid input(s): POCBNP  CBG: Recent Labs  Lab 01/28/20 0203  GLUCAP 90    Microbiology: Results for orders placed or performed during the hospital encounter of 01/28/20  SARS Coronavirus 2 by RT PCR (hospital order, performed in [facility] hospital lab) Nasopharyngeal Nasopharyngeal Swab     Status: None   Collection Time: 01/28/20  4:50 AM   Specimen: Nasopharyngeal Swab  Result Value Ref Range Status   SARS Coronavirus 2 NEGATIVE NEGATIVE Final    Comment: (NOTE) SARS-CoV-2 target nucleic acids are NOT DETECTED.  The SARS-CoV-2 RNA is generally detectable in upper and lower respiratory specimens during the acute phase of infection. The lowest concentration of SARS-CoV-2 viral copies this assay can detect is 250 copies / mL. A negative result does not preclude SARS-CoV-2 infection and should not be used as the sole basis for treatment or other patient management decisions.  A negative result may occur with improper specimen collection / handling, submission of specimen other than nasopharyngeal swab, presence of viral mutation(s) within the areas targeted by this assay, and inadequate number of viral copies (<250 copies / mL). A negative result must be combined with clinical observations, patient history, and epidemiological information.  Fact Sheet for Patients:   BoilerBrush.com.cy  Fact Sheet for Healthcare Providers: https://pope.com/  This test is not yet approved or  cleared by the [place] FDA and has been authorized for detection and/or diagnosis of SARS-CoV-2 by FDA under an Emergency Use Authorization (EUA).  This EUA will remain in effect (meaning this test can be used) for the duration of the COVID-19 declaration under Section 564(b)(1) of the Act, 21 U.S.C. section 360bbb-3(b)(1), unless the authorization is terminated or revoked sooner.  Performed at [facility], [address] [place], [place] [address]     Coagulation Studies: No results for input(s): LABPROT, INR in the last 72 hours.  Urinalysis: No results for input(s): COLORURINE, LABSPEC, PHURINE, GLUCOSEU, HGBUR, BILIRUBINUR, KETONESUR, PROTEINUR, UROBILINOGEN, NITRITE, LEUKOCYTESUR in the last 168 hours.  Invalid input(s): APPERANCEUR  Lipid Panel:  No results found for: CHOL, TRIG, HDL, CHOLHDL, VLDL, LDLCALC  HgbA1C: No results found for: HGBA1C  Urine Drug Screen:      Component Value Date/Time   LABOPIA NONE DETECTED 11/29/2019 1527   COCAINSCRNUR NONE DETECTED 11/29/2019 1527   LABBENZ POSITIVE (A) 11/29/2019 1527   AMPHETMU NONE DETECTED 11/29/2019 1527   THCU NONE DETECTED 11/29/2019 1527   LABBARB NONE DETECTED 11/29/2019 1527    Alcohol Level:  Recent Labs  Lab 01/28/20 0205  ETH <10    Other results: EKG: normal EKG, normal sinus rhythm, unchanged from previous tracings.  Imaging: CT Head Wo Contrast  Result Date: 01/28/2020 CLINICAL DATA:  Seizure EXAM: CT HEAD WITHOUT CONTRAST TECHNIQUE: Contiguous axial images were obtained from the base of the skull through the vertex without intravenous contrast. COMPARISON:  07/29/2019 FINDINGS:  Brain: There is moderate atrophy of the cerebellum, more than would be typically expected for a patient of this age. This appears similar to prior examination. No abnormal intra or extra-axial mass lesion or fluid collection. No abnormal mass effect or midline shift. No evidence of acute intracranial hemorrhage or infarct.  Ventricular size is normal. Cerebellum unremarkable. Vascular: Unremarkable Skull: Intact Sinuses/Orbits: Paranasal sinuses are clear. Orbits are unremarkable. Other: Mastoid air cells and middle ear cavities are clear. Moderate right frontal scalp hematoma. IMPRESSION: 1. Moderate right frontal scalp hematoma. No acute intracranial abnormality. 2. Moderate atrophy of the cerebellum, more than would be typically expected for a patient of this age. This appears similar to prior examination. Electronically Signed   By: Helyn Numbers MD   On: 01/28/2020 02:47   DG Chest Port 1 View  Result Date: 01/28/2020 CLINICAL DATA:  Seizure EXAM: PORTABLE CHEST 1 VIEW COMPARISON:  None. FINDINGS: The heart size and mediastinal contours are within normal limits. Both lungs are clear. The visualized skeletal structures are unremarkable. IMPRESSION: No active disease. Electronically Signed   By: Deatra Robinson M.D.   On: 01/28/2020 03:15     Assessment/Plan:  34 y.o. female  with medical history significant for epilepsy, followed at [facility] neurology, reportedly noncompliant with medication who was brought to the emergency room after she had 2 seizures at home, tonic-clonic, falling onto her face sustaining a hematoma to the forehead.  On arrival to the emergency room she was postictal and went on to have another seizure lasting 2 minutes aborted with Ativan  CT head showed right frontal scalp hematoma.  Patient was loaded with Keppra 1 g in the emergency room.  Now much improved and follows commands. Admits to periods of non compliance with AED.   - Loaded with Keppra - Keppra 500 BID which patient should have been on at home started  - Observation today - If no further seizures possible d/c tomorrow AM 01/28/2020, 11:26 AM

## 2020-01-28 NOTE — Progress Notes (Addendum)
PROGRESS NOTE    Laura Wyatt  ZOX:[id number] DOB: 07/04/1985 DOA: 01/28/2020 PCP: Patient, No Pcp Per   Brief Narrative:  Laura Wyatt is a 34 y.o. female with medical history significant for epilepsy, followed at [facility] neurology, reportedly noncompliant with medication who was brought to the emergency room after she had 2 seizures at home, tonic-clonic, falling onto her face sustaining a hematoma to the forehead.  On arrival to the emergency room she was postictal and went on to have another seizure lasting 2 minutes aborted with Ativan.Patient was tachycardic and tachypneic on arrival but with otherwise unremarkable vitals blood work significant for mild elevation in LFTs, hemoglobin 11.3.  EtOH level less than 10.  CT head showed right frontal scalp hematoma.Patient was loaded with Keppra 1 g in the emergency room.  Hospitalist consulted for admission.  Assessment & Plan: Active Problems:   Recurrent seizures (HCC)   Epilepsy (HCC)   Non-compliance with treatment Recurrent seizures (HCC)   Epilepsy (HCC)   Non-compliance with treatment -Patient loaded with 1 g Keppra in the emergency room -Continue Keppra 500 twice daily -Patient follows at [facility] -Neurology consult  Hematoma frontal scalp -Cool compresses and pain control -No acute injury on CT head  Elevated Glucose: -will check for a1c in setting of obesity.   Elevated LFT's: -Suspect fatty liver from possible borderline diabetes / metabolic syndrome related /prediabetes.   Subjective: Pt seen today morning and later in morning as nurse was concerned that pt may leave.  Nurse Ms.Erica was translating for patient and she reports that since past 15 years she Has had seizures but doesn't know why.   Objective: Vitals:   01/28/20 2140 01/28/20 2328 01/29/20 0528 01/29/20 0724  BP: 126/81 110/74 104/70 103/72  Pulse: (!) 102 95 79 86  Resp:  17  16  Temp: 98.5 F (36.9 C) 99.1 F (37.3 C) 98.8 F (37.1 C) 98.7 F  (37.1 C)  TempSrc: Oral Oral Oral Oral  SpO2: 96% 98% 98% 99%  Weight:   87.4 kg   Height:        Intake/Output Summary (Last 24 hours) at 01/29/2020 1255 Last data filed at 01/29/2020 0530 Gross per 24 hour  Intake 1733.9 ml  Output 400 ml  Net 1333.9 ml   Filed Weights   01/28/20 0255 01/28/20 0629 01/29/20 0528  Weight: 65.8 kg 88.6 kg 87.4 kg   Blood pressure 103/72, pulse 86, temperature 98.7 F (37.1 C), temperature source Oral, resp. rate 16, height 5' (1.524 m), weight 87.4 kg, last menstrual period 01/25/2020, SpO2 99 %. Examination: Blood pressure 103/72, pulse 86, temperature 98.7 F (37.1 C), temperature source Oral, resp. rate 16, height 5' (1.524 m), weight 87.4 kg, last menstrual period 01/25/2020, SpO2 99 %. General exam: Appears calm and comfortable  Respiratory system: Clear to auscultation. Respiratory effort normal. Cardiovascular system: S1 & S2 heard, RRR. No JVD, murmurs, rubs, gallops or clicks. No pedal edema. Gastrointestinal system: Abdomen is nondistended, soft and nontender. No organomegaly or masses felt. Normal bowel sounds heard. Central nervous system: Alert and oriented. No focal neurological deficits. Extremities: Symmetric 5 x 5 power. Skin: No rashes, lesions or ulcers Psychiatry: Judgement and insight appear normal. Mood & affect appropriate.  Data Reviewed: I have personally reviewed following labs and imaging studies  CBC: Recent Labs  Lab 01/28/20 0205  WBC 9.0  NEUTROABS 5.0  HGB 11.3*  HCT 30.4*  MCV 79.6*  PLT 269   Basic Metabolic Panel: Recent Labs  Lab  01/28/20 0205  NA 138  K 3.5  CL 102  CO2 21*  GLUCOSE 125*  BUN 13  CREATININE 0.62  CALCIUM 8.7*  MG 2.3   GFR: Estimated Creatinine Clearance: 99.3 mL/min (by C-G formula based on SCr of 0.62 mg/dL). Liver Function Tests: Recent Labs  Lab 01/28/20 0205  AST 72*  ALT 54*  ALKPHOS 84  BILITOT 0.8  PROT 7.5  ALBUMIN 3.9   CBG: Recent Labs  Lab  01/28/20 0203  GLUCAP 90   Thyroid Function Tests: Recent Labs    01/28/20 0205  TSH 3.493  FREET4 0.82   Anemia Panel: Recent Labs    01/28/20 0205  VITAMINB12 959*  FOLATE 16.8  FERRITIN 42  TIBC 386  IRON 49  RETICCTPCT 1.4    Recent Results (from the past 240 hour(s))  SARS Coronavirus 2 by RT PCR (hospital order, performed in [facility] hospital lab) Nasopharyngeal Nasopharyngeal Swab     Status: None   Collection Time: 01/28/20  4:50 AM   Specimen: Nasopharyngeal Swab  Result Value Ref Range Status   SARS Coronavirus 2 NEGATIVE NEGATIVE Final    Comment: (NOTE) SARS-CoV-2 target nucleic acids are NOT DETECTED.  The SARS-CoV-2 RNA is generally detectable in upper and lower respiratory specimens during the acute phase of infection. The lowest concentration of SARS-CoV-2 viral copies this assay can detect is 250 copies / mL. A negative result does not preclude SARS-CoV-2 infection and should not be used as the sole basis for treatment or other patient management decisions.  A negative result may occur with improper specimen collection / handling, submission of specimen other than nasopharyngeal swab, presence of viral mutation(s) within the areas targeted by this assay, and inadequate number of viral copies (<250 copies / mL). A negative result must be combined with clinical observations, patient history, and epidemiological information.  Fact Sheet for Patients:   BoilerBrush.com.cy  Fact Sheet for Healthcare Providers: https://pope.com/  This test is not yet approved or  cleared by the [place] FDA and has been authorized for detection and/or diagnosis of SARS-CoV-2 by FDA under an Emergency Use Authorization (EUA).  This EUA will remain in effect (meaning this test can be used) for the duration of the COVID-19 declaration under Section 564(b)(1) of the Act, 21 U.S.C. section 360bbb-3(b)(1), unless the  authorization is terminated or revoked sooner.  Performed at [facility], [address] [place], [place] [address]      Radiology Studies: CT Head Wo Contrast  Result Date: 01/28/2020 CLINICAL DATA:  Seizure EXAM: CT HEAD WITHOUT CONTRAST TECHNIQUE: Contiguous axial images were obtained from the base of the skull through the vertex without intravenous contrast. COMPARISON:  07/29/2019 FINDINGS: Brain: There is moderate atrophy of the cerebellum, more than would be typically expected for a patient of this age. This appears similar to prior examination. No abnormal intra or extra-axial mass lesion or fluid collection. No abnormal mass effect or midline shift. No evidence of acute intracranial hemorrhage or infarct. Ventricular size is normal. Cerebellum unremarkable. Vascular: Unremarkable Skull: Intact Sinuses/Orbits: Paranasal sinuses are clear. Orbits are unremarkable. Other: Mastoid air cells and middle ear cavities are clear. Moderate right frontal scalp hematoma. IMPRESSION: 1. Moderate right frontal scalp hematoma. No acute intracranial abnormality. 2. Moderate atrophy of the cerebellum, more than would be typically expected for a patient of this age. This appears similar to prior examination. Electronically Signed   By: Helyn Numbers MD   On: 01/28/2020 02:47  MR BRAIN W WO CONTRAST  Result Date: 01/28/2020 CLINICAL DATA:  History of epilepsy. Two seizures at home. Headache. EXAM: MRI HEAD WITHOUT AND WITH CONTRAST TECHNIQUE: Multiplanar, multiecho pulse sequences of the brain and surrounding structures were obtained without and with intravenous contrast. CONTRAST:  90mL GADAVIST GADOBUTROL 1 MMOL/ML IV SOLN COMPARISON:  Head CT same day FINDINGS: Brain: Diffusion imaging does not show any acute or subacute infarction or other cause of restricted diffusion. The brainstem and cerebellum are normal except for some cerebellar atrophy. Cerebral hemispheres do not show any malformation,  small or large vessel infarction, mass lesion, hemorrhage, hydrocephalus or extra-axial collection. 2 mm cyst in the right caudate body, not likely significant. Mesial temporal lobes are symmetric and normal. After contrast administration, no abnormal enhancement occurs. Vascular: No abnormal vascular finding. Skull and upper cervical spine: Negative Sinuses/Orbits: Clear/normal Other: Right forehead scalp hematoma and swelling. IMPRESSION: No intracranial abnormality. No abnormality seen to explain seizures. No sign of postictal sequela. Right frontal scalp swelling and hematoma. Electronically Signed   By: Paulina Fusi M.D.   On: 01/28/2020 19:40   DG Chest Port 1 View  Result Date: 01/28/2020 CLINICAL DATA:  Seizure EXAM: PORTABLE CHEST 1 VIEW COMPARISON:  None. FINDINGS: The heart size and mediastinal contours are within normal limits. Both lungs are clear. The visualized skeletal structures are unremarkable. IMPRESSION: No active disease. Electronically Signed   By: Deatra Robinson M.D.   On: 01/28/2020 03:15     Scheduled Meds:  enoxaparin (LOVENOX) injection  40 mg Subcutaneous Q24H   levETIRAcetam  500 mg Oral BID   LORazepam  2 mg Intravenous Once   Continuous Infusions:  magnesium sulfate bolus IVPB       LOS: 0 days    Gertha Calkin, MD [facility] Pager [phone] If 7PM-7AM, please contact night-coverage [email or web address] Password [facility] 01/29/2020, 12:55 PM

## 2020-01-28 NOTE — Plan of Care (Signed)
  Problem: Clinical Measurements: Goal: Ability to maintain clinical measurements within normal limits will improve Outcome: Progressing Goal: Respiratory complications will improve Outcome: Progressing Goal: Cardiovascular complication will be avoided Outcome: Progressing   Problem: Clinical Measurements: Goal: Complications related to the disease process, condition or treatment will be avoided or minimized Outcome: Progressing

## 2020-01-28 NOTE — ED Notes (Signed)
Pt seizing

## 2020-01-29 DIAGNOSIS — G40309 Generalized idiopathic epilepsy and epileptic syndromes, not intractable, without status epilepticus: Secondary | ICD-10-CM

## 2020-01-29 MED ORDER — LEVETIRACETAM 500 MG PO TABS: 500.0000 mg | ORAL_TABLET | Freq: Two times a day (BID) | ORAL | Status: DC

## 2020-01-29 NOTE — Discharge Summary (Signed)
Physician Discharge Summary  Laura Wyatt GUY:[id number] DOB: 04/14/1986 DOA: 01/28/2020  PCP: Patient, No Pcp Per  Admit date: 01/28/2020 Discharge date: 01/30/2020  Time spent: 30 minutes  Discharge Diagnoses:  Active Problems:   Recurrent seizures (HCC)   Epilepsy (HCC)   Non-compliance with treatment Recurrent seizures (HCC)   Epilepsy (HCC)   Non-compliance with treatment -Patient loaded with 1 g Keppra in the emergency room -Continue Keppra 500 twice daily -Patient follows at [facility] -Neurology consult   Hematoma frontal scalp -Cool compresses and pain control -No acute injury on CT head   Elevated Glucose: -will check for a1c in setting of obesity.    Elevated LFT's: -Suspect fatty liver from possible borderline diabetes / metabolic syndrome related /prediabetes.   Discharge Condition:  Good. Diet recommendation:  Low fat  Heart healthy diet.  Filed Weights   01/28/20 0255 01/28/20 0629 01/29/20 0528  Weight: 65.8 kg 88.6 kg 87.4 kg    History of present illness:  Laura Wyatt is a 34 y.o. female with medical history significant for epilepsy, followed at [facility] neurology, reportedly noncompliant with medication who was brought to the emergency room after she had 2 seizures at home, tonic-clonic, falling onto her face sustaining a hematoma to the forehead.  On arrival to the emergency room she was postictal and went on to have another seizure lasting 2 minutes aborted with Ativan.Patient was tachycardic and tachypneic on arrival but with otherwise unremarkable vitals blood work significant for mild elevation in LFTs, hemoglobin 11.3.  EtOH level less than 10.  CT head showed right frontal scalp hematoma.  Patient was loaded with Keppra 1 g in the emergency room.  Hospitalist consulted for admission.  Hospital Course:  Patient is a 34 year old Spanish-speaking female admitted for seizure with a history of epilepsy and noncompliance with medications.  Patient reports  that she takes her seizure medications regularly.  Patient's hospital stay has been stable seizures resolved with initial treatment with IV loading of Keppra followed by scheduled p.o. dosing.  Neurology was consulted and MRI of the brain was found to be negative.Pt  Has been stable and denies any complaints today and counseled on compliance with seizure meds and  Exercise and weight loss.   Procedures: None.   Consultations: Neurology-Dr.Zeylikman.  Discharge Exam: Vitals:   01/29/20 0528 01/29/20 0724  BP: 104/70 103/72  Pulse: 79 86  Resp:  16  Temp: 98.8 F (37.1 C) 98.7 F (37.1 C)  SpO2: 98% 99%   Physical Exam Vitals and nursing note reviewed.  Constitutional:      Appearance: Normal appearance.  HENT:     Head: Normocephalic and atraumatic.     Right Ear: External ear normal.     Left Ear: External ear normal.     Nose: Nose normal.     Mouth/Throat:     Pharynx: Oropharynx is clear.  Eyes:     Extraocular Movements: Extraocular movements intact.  Cardiovascular:     Rate and Rhythm: Normal rate and regular rhythm.     Pulses: Normal pulses.     Heart sounds: Normal heart sounds.  Pulmonary:     Effort: Pulmonary effort is normal.     Breath sounds: Normal breath sounds.  Abdominal:     General: There is no distension.     Palpations: Abdomen is soft. There is no mass.     Tenderness: There is no abdominal tenderness.  Musculoskeletal:        General: Normal range of motion.  Skin:    General: Skin is warm.  Neurological:     General: No focal deficit present.     Mental Status: She is alert and oriented to person, place, and time.  Psychiatric:        Mood and Affect: Mood normal.        Behavior: Behavior normal.    Discharge Instructions Discharge Instructions     Call MD for:  difficulty breathing, headache or visual disturbances   Complete by: As directed    Call MD for:  extreme fatigue   Complete by: As directed    Call MD for:  hives    Complete by: As directed    Call MD for:  persistant dizziness or light-headedness   Complete by: As directed    Call MD for:  persistant nausea and vomiting   Complete by: As directed    Call MD for:  redness, tenderness, or signs of infection (pain, swelling, redness, odor or green/yellow discharge around incision site)   Complete by: As directed    Call MD for:  severe uncontrolled pain   Complete by: As directed    Call MD for:  temperature >100.4   Complete by: As directed    Diet - low sodium heart healthy   Complete by: As directed    Discharge instructions   Complete by: As directed    Please f/u with primary care doctor and take your meds as advised.   Increase activity slowly   Complete by: As directed    No wound care   Complete by: As directed       Allergies as of 01/29/2020   No Known Allergies      Medication List     TAKE these medications    levETIRAcetam 1000 MG tablet Commonly known as: KEPPRA Take 1,000 mg by mouth 2 (two) times daily.       No Known Allergies  Follow-up Information     [facility]. Schedule an appointment as soon as possible for a visit in 1 week.          [facility]. Follow up in 2 week(s).                   The results of significant diagnostics from this hospitalization (including imaging, microbiology, ancillary and laboratory) are listed below for reference.    Significant Diagnostic Studies: CT Head Wo Contrast  Result Date: 01/28/2020 CLINICAL DATA:  Seizure EXAM: CT HEAD WITHOUT CONTRAST TECHNIQUE: Contiguous axial images were obtained from the base of the skull through the vertex without intravenous contrast. COMPARISON:  07/29/2019 FINDINGS: Brain: There is moderate atrophy of the cerebellum, more than would be typically expected for a patient of this age. This appears similar to prior examination. No abnormal intra or extra-axial mass lesion or fluid collection. No abnormal mass effect or midline  shift. No evidence of acute intracranial hemorrhage or infarct. Ventricular size is normal. Cerebellum unremarkable. Vascular: Unremarkable Skull: Intact Sinuses/Orbits: Paranasal sinuses are clear. Orbits are unremarkable. Other: Mastoid air cells and middle ear cavities are clear. Moderate right frontal scalp hematoma. IMPRESSION: 1. Moderate right frontal scalp hematoma. No acute intracranial abnormality. 2. Moderate atrophy of the cerebellum, more than would be typically expected for a patient of this age. This appears similar to prior examination. Electronically Signed   By: Helyn NumbersAshesh  Parikh MD   On: 01/28/2020 02:47   MR BRAIN W WO CONTRAST  Result Date: 01/28/2020 CLINICAL DATA:  History of epilepsy. Two seizures at home. Headache. EXAM: MRI HEAD WITHOUT AND WITH CONTRAST TECHNIQUE: Multiplanar, multiecho pulse sequences of the brain and surrounding structures were obtained without and with intravenous contrast. CONTRAST:  49mL GADAVIST GADOBUTROL 1 MMOL/ML IV SOLN COMPARISON:  Head CT same day FINDINGS: Brain: Diffusion imaging does not show any acute or subacute infarction or other cause of restricted diffusion. The brainstem and cerebellum are normal except for some cerebellar atrophy. Cerebral hemispheres do not show any malformation, small or large vessel infarction, mass lesion, hemorrhage, hydrocephalus or extra-axial collection. 2 mm cyst in the right caudate body, not likely significant. Mesial temporal lobes are symmetric and normal. After contrast administration, no abnormal enhancement occurs. Vascular: No abnormal vascular finding. Skull and upper cervical spine: Negative Sinuses/Orbits: Clear/normal Other: Right forehead scalp hematoma and swelling. IMPRESSION: No intracranial abnormality. No abnormality seen to explain seizures. No sign of postictal sequela. Right frontal scalp swelling and hematoma. Electronically Signed   By: Paulina Fusi M.D.   On: 01/28/2020 19:40   DG Chest Port 1  View  Result Date: 01/28/2020 CLINICAL DATA:  Seizure EXAM: PORTABLE CHEST 1 VIEW COMPARISON:  None. FINDINGS: The heart size and mediastinal contours are within normal limits. Both lungs are clear. The visualized skeletal structures are unremarkable. IMPRESSION: No active disease. Electronically Signed   By: Deatra Robinson M.D.   On: 01/28/2020 03:15     Microbiology: Recent Results (from the past 240 hour(s))  SARS Coronavirus 2 by RT PCR (hospital order, performed in [facility] hospital lab) Nasopharyngeal Nasopharyngeal Swab     Status: None   Collection Time: 01/28/20  4:50 AM   Specimen: Nasopharyngeal Swab  Result Value Ref Range Status   SARS Coronavirus 2 NEGATIVE NEGATIVE Final    Comment: (NOTE) SARS-CoV-2 target nucleic acids are NOT DETECTED.  The SARS-CoV-2 RNA is generally detectable in upper and lower respiratory specimens during the acute phase of infection. The lowest concentration of SARS-CoV-2 viral copies this assay can detect is 250 copies / mL. A negative result does not preclude SARS-CoV-2 infection and should not be used as the sole basis for treatment or other patient management decisions.  A negative result may occur with improper specimen collection / handling, submission of specimen other than nasopharyngeal swab, presence of viral mutation(s) within the areas targeted by this assay, and inadequate number of viral copies (<250 copies / mL). A negative result must be combined with clinical observations, patient history, and epidemiological information.  Fact Sheet for Patients:   BoilerBrush.com.cy  Fact Sheet for Healthcare Providers: https://pope.com/  This test is not yet approved or  cleared by the [place] FDA and has been authorized for detection and/or diagnosis of SARS-CoV-2 by FDA under an Emergency Use Authorization (EUA).  This EUA will remain in effect (meaning this test can be used) for  the duration of the COVID-19 declaration under Section 564(b)(1) of the Act, 21 U.S.C. section 360bbb-3(b)(1), unless the authorization is terminated or revoked sooner.  Performed at [facility], [address] [place], [place] [address]      Labs: Basic Metabolic Panel: Recent Labs  Lab 01/28/20 0205  NA 138  K 3.5  CL 102  CO2 21*  GLUCOSE 125*  BUN 13  CREATININE 0.62  CALCIUM 8.7*  MG 2.3   Liver Function Tests: Recent Labs  Lab 01/28/20 0205  AST 72*  ALT 54*  ALKPHOS 84  BILITOT 0.8  PROT 7.5  ALBUMIN 3.9  No results for input(s): LIPASE, AMYLASE in the last 168 hours. No results for input(s): AMMONIA in the last 168 hours. CBC: Recent Labs  Lab 01/28/20 0205  WBC 9.0  NEUTROABS 5.0  HGB 11.3*  HCT 30.4*  MCV 79.6*  PLT 269   Cardiac Enzymes: No results for input(s): CKTOTAL, CKMB, CKMBINDEX, TROPONINI in the last 168 hours. BNP: BNP (last 3 results) No results for input(s): BNP in the last 8760 hours.  ProBNP (last 3 results) No results for input(s): PROBNP in the last 8760 hours.  CBG: Recent Labs  Lab 01/28/20 0203  GLUCAP 90    Signed:  Gertha Calkin MD.  [facility] 01/30/2020, 6:04 PM

## 2020-01-29 NOTE — TOC Progression Note (Signed)
Transition of Care [facility]) - Progression Note    Patient Details  Name: Laura Wyatt MRN: [id number] Date of Birth: [birth year]  [facility] CM/SW Contact  Shawn Route, RN Phone Number: 01/29/2020, 9:41 AM  Clinical Narrative:    Referral requested for [facility] and medication management listed in pharmacy demographics.         Expected Discharge Plan and Services                                                 Social Determinants of Health (SDOH) Interventions    Readmission Risk Interventions No flowsheet data found.

## 2020-01-29 NOTE — Progress Notes (Signed)
paitent alert and oriented, vss no complaints of pain.  Escorted patient out of hospital via wheelchair.  D/c telemetry and piv.

## 2020-01-29 NOTE — Discharge Instructions (Signed)
You must take your seizure medicine daily as directed by your neurologist. Do not drive or operate heavy machinery. Return to the ER for worsening symptoms, recurrent seizures, persistent vomiting, lethargy or other concerns.    Epilepsia Epilepsy La epilepsia es una afeccin en la cual la persona tiene convulsiones repetidas con el paso del [place]. Una convulsin es una rfaga repentina de actividad elctrica y qumica anormal en el cerebro. Las [employer] provocar un cambio en la atencin, la conducta o la capacidad de [place] despierto y Control and instrumentation engineer (estado mental alterado). La epilepsia aumenta el riesgo de cadas, accidentes y lesiones. Tambin puede producir complicaciones, que incluyen lo siguiente:  Depresin.  Mala memoria.  Muerte sbita e inexplicable en epilepsia (SUDEP). Esta complicacin es poco frecuente, y no se conoce la causa. La mayora de las personas con epilepsia tiene una vida normal. Cules son las causas? Esta afeccin puede ser causada por lo siguiente:  Lesin en la cabeza.  Lesin que ocurre al [facility].  Fiebre alta durante la infancia.  Accidente cerebrovascular.  Hemorragias en el cerebro o alrededor de este.  Ciertos medicamentos y drogas.  Tener muy poco oxgeno durante un perodo prolongado.  Desarrollo anormal del cerebro.  Ciertas infecciones, como meningitis y encefalitis.  Tumores cerebrales.  Afecciones que se transmiten de padres a hijos (hereditarias). Cules son los signos o los sntomas? Los sntomas de una convulsin varan mucho de Neomia Dear persona a Educational psychologist. Pueden incluir los siguientes:  Espasmos.  El cuerpo se pone rgido.  Movimientos involuntarios de los brazos o las piernas.  Prdida del conocimiento.  Si tiene problemas respiratorios.  Cada repentina.  Confusin.  Movimientos de asentimiento con la cabeza.  Parpadeo o movimientos de abrir y Retail buyer ojos.  Chasquido de labios.  Babeo.  Movimientos  rpidos de los ojos.  Gruidos.  Prdida del control del intestino y de la vejiga.  Mirar fijamente.  Falta de [place]. Algunas personas tienen sntomas apenas antes de que ocurra una convulsin (aura) e inmediatamente despus de la convulsin. Los sntomas del aura incluyen los siguientes:  [place] o ansiedad.  Nuseas.  Sensacin de que la habitacin da vueltas (vrtigo).  Sensacin de haber visto o escuchado algo antes (dj vu).  Percepcin de sabores u olores extraos.  Cambios en la visin, como ver manchas o luces destellantes. Los sntomas que ocurren despus de una convulsin incluyen los siguientes:  Confusin.  Somnolencia.  Dolor de [place]. Cmo se diagnostica? Esta afeccin se diagnostica en funcin de lo siguiente:  Sus sntomas.  Sus antecedentes mdicos.  Un examen fsico.  Un examen neurolgico. Un examen neurolgico es similar a un examen fsico. Consiste en examinar la fuerza, los reflejos, la coordinacin y las sensaciones.  Estudios, como, por ejemplo: ? Un estudio indoloro que crea un diagrama de las ondas cerebrales (electroencefalograma oEEG). ? Una resonancia magntica (RM). ? Una exploracin por tomografa computarizada (TC). ? Una puncin lumbar, tambin llamada puncin espinal. ? Anlisis de sangre para detectar signos de infeccin o bioqumica anormal de la sangre. Cmo se trata? El tratamiento puede controlar las convulsiones. Algunos tipos de epilepsia necesitan tratamiento de por vida y otros desaparecen con el tiempo. El tratamiento de esta afeccin puede incluir lo siguiente:  Tomar medicamentos para Chief Operating Officer las convulsiones.  Tener implantado en el trax un dispositivo llamado estimulador del nervio vago. El dispositivo enva impulsos elctricos al nervio vago y al cerebro para prevenir las convulsiones. Se puede recomendar este tratamiento si los medicamentos no son de [place].  Ciruga de cerebro. Hay varios tipos de cirugas  que se pueden Education officer, environmental para impedir que ocurran las convulsiones o para reducir su frecuencia.  Realizarse anlisis de [place] peridicos. Es posible que deba realizarse anlisis de sangre de forma peridica para comprobar que est recibiendo la cantidad [place] de medicamentos. Una vez que se diagnostica esta afeccin, es importante comenzar un tratamiento lo antes posible. En algunas personas, la epilepsia desaparece con el tiempo. Siga estas instrucciones en su casa: Medicamentos  [employer] de venta libre y los recetados solamente como se lo haya indicado el mdico.  Evite cualquier sustancia que pueda interferir con el funcionamiento de su medicamento, como el alcohol. Actividad  Descanse lo suficiente. La falta de sueo puede aumentar la probabilidad de sufrir convulsiones.  Siga las instrucciones del mdico sobre conducir, Programmer, systems y Education officer, environmental otras actividades que podran ser peligrosas si tuviera una convulsin. ? Si vive en los [place], consulte al Departamento de Vehculos Motorizados ([place] local para averiguar sobre las leyes de trnsito locales. Cada estado tiene normas especficas sobre cundo puede volver a Public house manager. Educar a los [employer] a sus amigos y familiares lo que deben hacer si tiene una convulsin. Ellos deben hacer lo siguiente:  Acostarlo en el suelo para evitar que se caiga.  Colocarle cosas que amortigen debajo de la cabeza y el cuerpo.  Aflojar la ropa apretada alrededor de su cuello.  Recostarlo sobre un lado. En caso de tener vmitos, esto ayuda a Pharmacologist las vas respiratorias despejadas.  Lennie Hummer con usted hasta que se recupere.  No sostenerlo presionndolo hacia abajo. Hacer esto no detendr la convulsin.  No colocarle nada en la boca.  Saber si usted necesita atencin de emergencia o no.  Instrucciones generales  Evite cualquier cosa que en algn momento le haya desencadenado una convulsin.  Lleve un diario de  sus convulsiones. Registre lo que recuerde [employer], en especial, cualquier cosa que pueda haber desencadenado la convulsin.  Concurra a todas las visitas de [address] se lo haya indicado el mdico. Esto es importante. Comunquese con un mdico si:  Su patrn de convulsiones cambia.  Tiene sntomas de infeccin u otra enfermedad. Esto podra aumentar el riesgo de tener una convulsin. Solicite ayuda inmediatamente si:  Tiene lo siguiente: ? Una convulsin que dura ms de . ? Varias convulsiones consecutivas sin una recuperacin completa entre una convulsin y la [place]. ? Una convulsin que le dificulta la respiracin. ? Una convulsin que es diferente a las convulsiones anteriores. ? Una convulsin que lo deja sin poder hablar ni usar una parte del cuerpo.  No se despert de inmediato despus de una convulsin. Estos sntomas pueden representar un problema grave que constituye Radio broadcast assistant. No espere a ver si los sntomas desaparecen. Solicite atencin mdica de inmediato. Comunquese con el servicio de emergencias de su localidad (911 en los [place]). No conduzca por sus propios medios [employer] [facility]. Resumen  La epilepsia es una afeccin en la cual la persona tiene convulsiones repetidas con el paso del [place]. Algunos tipos de epilepsia necesitan tratamiento de por vida y otros desaparecen con el tiempo.  Las convulsiones pueden causar muchos sntomas, desde episodios breves de ausencia o movimientos involuntarios de los brazos o las piernas hasta convulsiones con prdida de la conciencia.  El tratamiento es eficaz para Chief Operating Officer las convulsiones. Tome los medicamentos de venta libre y los recetados solamente como se lo haya indicado el mdico.  Siga las instrucciones del mdico sobre  conducir, nadar y Education officer, environmental otras actividades que podran ser peligrosas si tuviera una convulsin.  Ensee a sus amigos y familiares lo que deben hacer si tiene  una convulsin. Esta informacin no tiene Theme park manager el consejo del mdico. Asegrese de hacerle al mdico cualquier pregunta que tenga. Document Revised: 02/15/2018 Document Reviewed: 02/15/2018 Elsevier Patient Education  2020 [employer].   [employer] adultos Seizure, Adult Una convulsin es una rfaga repentina de actividad elctrica anormal en el cerebro. Las convulsiones generalmente duran entre 30segundos y . Pueden provocar muchos sntomas diferentes. Generalmente, las convulsiones no causan dao, excepto si duran [employer]. Cules son las causas? Las causas ms frecuentes de esta afeccin incluyen las siguientes:  Fiebre o infeccin.  Trastornos que Designer, jewellery, como los siguientes: ? Una anormalidad cerebral con la que se naci. ? Una lesin en la cabeza o el cerebro. ? Hemorragia cerebral. ? Un tumor. ? Accidente cerebrovascular. ? Trastornos cerebrales tales como autismo o parlisis cerebral.  Bajo nivel de glucosa en la sangre.  Enfermedades que se transmiten de padres a hijos (son hereditarias).  Problemas con sustancias, por ejemplo: ? Tener una reaccin a una droga o un medicamento. ? Interrupcin repentina del uso de una sustancia (abstinencia). En algunos casos, es posible que la causa se desconozca. Una persona que tiene convulsiones que se repiten con el transcurso del tiempo sin una causa aparente sufre un trastorno llamado "epilepsia". Qu incrementa el riesgo? Es ms probable que tenga esta afeccin si:  Tiene antecedentes familiares de epilepsia.  Tuvo una convulsin en el pasado.  Tiene un trastorno cerebral.  Tiene antecedentes de lesin en la cabeza, falta de oxgeno en el nacimiento o accidentes cerebrovasculares. Cules son los signos o los sntomas? Hay muchos tipos de convulsiones. Los sntomas varan segn el tipo de convulsin que tenga. Algunos ejemplos de sntomas que se producen durante una  convulsin son los siguientes:  Sacudidas (convulsiones).  Rigidez del cuerpo.  Desmayo (prdida de la conciencia).  Movimientos de asentimiento con la cabeza.  Mirar fijamente.  No responder a los sonidos o al tacto.  Prdida del control del intestino y de la vejiga. Algunas personas tienen sntomas inmediatamente antes e inmediatamente despus de que se produzca la convulsin. Los sntomas previos a una convulsin pueden ser los siguientes:  Physicist, medical.  Preocupacin (ansiedad).  Sensacin de que va a vomitar (nuseas).  Sensacin de que la habitacin da vueltas (vrtigo).  Sensacin de que ya vio u oy algo antes (dj vu).  Percepcin de sabores u olores extraos.  Cambios en la forma de ver. Quizs vea manchas o luces intermitentes. Los sntomas que se producen despus de una convulsin pueden incluir:  Confusin.  Somnolencia.  Dolor de [place].  Debilidad en un lado del cuerpo. Cmo se trata? La mayora de las convulsiones se detienen solas en menos de . En algunos los casos, no se necesita tratamiento. Las convulsiones que duran ms de 5 minutos generalmente necesitan tratamiento. Entre las opciones de tratamiento se incluyen las siguientes:  Medicamentos a travs de una va intravenosa (IV).  Evitar las cosas que se sabe que causan las convulsiones. Estas pueden incluir medicamentos que toma para otra afeccin.  Medicamentos para tratar la epilepsia.  Ciruga para detener las convulsiones. Puede ser necesaria si los medicamentos no son eficaces. Siga estas indicaciones en su casa: Medicamentos  [employer] de venta libre y los recetados solamente como se lo haya indicado el mdico.  No coma ni beba  nada que pueda alterar el funcionamiento del medicamento, como el alcohol. Actividad  No haga ninguna actividad que sera peligrosa si tuviera otra convulsin, como conducir o nadar. Espere hasta que el mdico le diga que es seguro que haga  esas [place].  Si vive en los [place], consulte al [employer] (Departamento de Vehculos Motorizados) local cundo puede volver a Science writer.  Descanse mucho. Ensearles a otros [place] a sus amigos y familiares lo que deben hacer si usted tiene una convulsin. Ellos deben hacer lo siguiente:  Recostarlo en el suelo.  Protegerle la cabeza y el cuerpo.  Aflojar la ropa apretada alrededor de su cuello.  Recostarlo sobre un lado.  No sostenerlo presionndolo hacia abajo.  No colocarle nada en la boca.  Saber si usted necesita atencin de emergencia o no.  Permanecer con usted hasta que se sienta mejor.  Indicaciones generales  Comunquese con el mdico cada vez que tenga una convulsin.  Evite todo lo que le cause convulsiones.  Lleve un diario de sus convulsiones. Escriba los siguientes datos: ? Lo que usted cree que caus cada convulsin. ? Lo que recuerda sobre cada convulsin.  Concurra a todas las visitas de [address] se lo haya indicado el mdico. Esto es importante. Comunquese con un mdico si:  Tiene otra convulsin.  Tiene convulsiones con mayor frecuencia.  Se producen cambios en lo que ocurre durante las convulsiones.  Contina teniendo convulsiones con [employer].  Tiene sntomas de estar enfermo o de tener una infeccin. Solicite ayuda inmediatamente si:  Tiene una convulsin que: ? Dura ms de . ? Es diferente a las convulsiones que tuvo antes. ? Le dificulta la respiracin. ? Sucede despus de Economist cabeza.  Tiene alguno de estos sntomas despus de una convulsin: ? Imposibilidad de hablar. ? Imposibilidad de usar una parte del cuerpo. ? Confusin. ? Dolor de [employer].  Tiene dos o ms convulsiones consecutivas.  No se despierta inmediatamente despus de una convulsin.  Se lesiona durante una convulsin. Estos sntomas pueden Customer service manager. No espere a ver si los sntomas desaparecen.  Solicite atencin mdica de inmediato. Comunquese con el servicio de emergencias de su localidad (911 en los [place]). No conduzca por sus propios medios [employer]. Resumen  Las convulsiones generalmente duran entre 30segundos y . No suelen ser dainas, excepto si duran [employer].  No coma ni beba nada que pueda alterar el funcionamiento del medicamento, como el alcohol.  Ensee a sus amigos y familiares lo que deben hacer si usted tiene una convulsin.  Comunquese con el mdico cada vez que tenga una convulsin. Esta informacin no tiene Theme park manager el consejo del mdico. Asegrese de hacerle al mdico cualquier pregunta que tenga. Document Revised: 09/14/2018 Document Reviewed: 09/14/2018 Elsevier Patient Education  2020 [employer].   [employer] adultos Seizure, Adult Una convulsin es una rfaga repentina de actividad elctrica anormal en el cerebro. Las convulsiones generalmente duran entre 30segundos y . Esta actividad anormal interrumpe temporalmente el funcionamiento normal del cerebro. Una convulsin puede causar muchos sntomas diferentes en funcin del lugar del cerebro en el que comience. Cules son las causas? Las causas ms frecuentes de esta afeccin incluyen las siguientes:  Fiebre o infeccin.  Anormalidad cerebral, lesin, hemorragia o tumor.  Bajo nivel de glucosa en la [place].  Trastornos metablicos u otras afecciones que se transmiten de padres a hijos (son hereditarios).  Reaccin a una sustancia, como una droga o un medicamento, o la  interrupcin repentina del uso de una sustancia (abstinencia).  Accidente cerebrovascular.  Trastornos del desarrollo, tales como autismo o parlisis cerebral. En algunos casos, puede desconocerse la causa de esta afeccin. Algunas personas que tienen una convulsin nunca ms Tanzaniatienen otra. Por lo general, las convulsiones no causan daos cerebrales ni problemas  permanentes, a menos que sean prolongadas. Una persona que tiene convulsiones que se repiten con el transcurso del tiempo sin una causa aparente sufre un trastorno llamado "epilepsia". Qu incrementa el riesgo? Es ms probable que tenga esta afeccin si:  Tiene antecedentes familiares de epilepsia.  Tuvo una crisis tnico-clnica en el pasado. Este es un tipo de convulsin que implica la contraccin de los msculos de todo el cuerpo y la prdida de la conciencia.  Tiene autismo, parlisis cerebral u otros trastornos cerebrales.  Tiene antecedentes de traumatismo en la cabeza, falta de oxgeno en el nacimiento o accidentes cerebrovasculares. Cules son los signos o los sntomas? Hay muchos tipos diferentes de convulsiones. Los sntomas de una convulsin varan segn el tipo de convulsin que tenga. Algunos ejemplos de sntomas que se producen durante una convulsin son los siguientes:  Sacudidas incontrolables (convulsiones).  Rigidez del cuerpo.  Prdida del conocimiento.  Movimientos de asentimiento con la cabeza.  Mirar fijamente.  No responder a los sonidos o al tacto.  Prdida del control del intestino o de la vejiga. Algunas personas tienen sntomas apenas antes de que ocurra una convulsin (aura) e inmediatamente despus de la convulsin (estupor postictal). Los sntomas previos a una convulsin pueden ser los siguientes:  [place]o o ansiedad.  Nuseas.  Sensacin de que la habitacin da vueltas (vrtigo).  Sensacin de haber visto o escuchado algo antes (dj vu).  Percepcin de sabores u olores extraos.  Cambios en la visin, como ver manchas o luces destellantes. Los sntomas que se producen despus de una convulsin pueden incluir:  Confusin.  Somnolencia.  Dolor de [place].  Debilidad en un lado del cuerpo. Cmo se diagnostica? Esta afeccin se puede diagnosticar en funcin de lo siguiente:  Una descripcin de sus sntomas. Un video de las convulsiones  puede ser de [place].  Sus antecedentes mdicos.  Un examen fsico. Tambin pueden hacerle estudios, que incluyen los siguientes:  Anlisis de [place].  Exploracin por tomografa computarizada (TC).  Resonancia magntica (RM).  Electroencefalograma (EEG). Este estudio mide la actividad elctrica del cerebro. Un EEG puede determinar si las convulsiones regresarn (recurrencia).  Una puncin lumbar (tambin llamada puncin espinal). Es la extraccin y el anlisis del lquido que rodea el cerebro y la mdula espinal. Cmo se trata? La mayora de las convulsiones se detienen solas en menos de 5minutos y no se Insurance underwriternecesita tratamiento. Las convulsiones que duran ms de 5 minutos generalmente necesitan tratamiento. Entre las opciones de tratamiento se incluyen las siguientes:  Medicamentos administrados a travs de una va intravenosa (IV).  Evitar los desencadenantes conocidos, por ejemplo, medicamentos que toma para otra afeccin.  Medicamentos para tratar la epilepsia (antiepilpticos) si la causa de las convulsiones es la epilepsia.  Ciruga para detener las convulsiones si tiene epilepsia que no responde a los medicamentos. Siga estas indicaciones en su casa: Medicamentos  Tome los medicamentos de venta libre y los recetados solamente como se lo haya indicado el mdico.  Evite cualquier sustancia que pueda interferir con el funcionamiento de su medicamento, como el alcohol. Actividad  No conduzca, nade ni haga ninguna otra actividad que sera peligrosa si tuviera otra convulsin. Espere hasta que el Office Depotmdico le diga que es  seguro.  Si vive en los [place], consulte al [employer] (Departamento de Vehculos Motorizados) local para Financial risk analyst sobre las leyes de trnsito locales. Cada estado tiene normas especficas sobre cundo puede volver a Public house manager.  Descanse lo suficiente. La falta de sueo puede aumentar la probabilidad de sufrir convulsiones. Educar a los [employer] a  sus amigos y familiares lo que deben hacer si tiene una convulsin. Ellos deben hacer lo siguiente:  Acostarlo en el suelo para evitar que se caiga.  Colocarle cosas que amortigen debajo de la cabeza y el cuerpo.  Aflojar la ropa apretada alrededor de su cuello.  Recostarlo sobre un lado. En caso de tener vmitos, esto ayuda a Pharmacologist las vas respiratorias despejadas.  No sostenerlo presionndolo hacia abajo. Hacer esto no detendr la convulsin.  No colocarle nada en la boca.  Saber si usted necesita atencin de emergencia o no. Por ejemplo, deben obtener ayuda de inmediato si tiene una convulsin que dura ms de 5 minutos o tiene varias convulsiones seguidas.  Lennie Hummer con usted hasta que se recupere.  Indicaciones generales  Comunquese con el mdico cada vez que tenga una convulsin.  Evite cualquier cosa que en algn momento le haya desencadenado una convulsin.  Lleve un diario de sus convulsiones. Registre lo que recuerde [employer], en especial, cualquier cosa que pueda haber desencadenado la convulsin.  Concurra a todas las visitas de [address] se lo haya indicado el mdico. Esto es importante. Comunquese con un mdico si:  Tiene otra convulsin.  Tiene convulsiones con mayor frecuencia.  Los sntomas de sus convulsiones [place].  Contina teniendo convulsiones con [employer].  Tiene sntomas de alguna infeccin o enfermedad. Esto podra aumentar el riesgo de tener una convulsin. Solicite ayuda inmediatamente si:  Tiene una convulsin que: ? Dura ms de . ? Es diferente de las [employer]. ? Lo deja sin capacidad de hablar ni usar una parte de su cuerpo. ? Le dificulta la respiracin.  Tiene lo siguiente: ? Una convulsin despus de una lesin en la cabeza. ? Varias convulsiones consecutivas. ? Confusin o dolor de cabeza intenso inmediatamente despus de una convulsin.  No se despierta de inmediato despus  de una convulsin.  Se lesiona durante una convulsin. Estos sntomas pueden representar un problema grave que constituye Radio broadcast assistant. No espere a ver si los sntomas desaparecen. Solicite atencin mdica de inmediato. Comunquese con el servicio de emergencias de su localidad (911 en los [place]). No conduzca por sus propios medios [employer] hospital. Resumen  Las convulsiones son causadas por [place] Griff[place] elctrica anormal en el cerebro. La actividad altera el funcionamiento cerebral normal y puede causar varios sntomas, como convulsiones, movimientos anormales o una modificacin en la conciencia.  Las convulsiones pueden tener muchas causas, como enfermedades, medicamentos, afecciones genticas, lesiones en la cabeza, accidentes cerebrovasculares, tumores, abuso de sustancias o sndrome de abstinencia.  La mayora de las convulsiones se detienen solas en menos de . Las convulsiones que duran ms de 5 minutos constituyen una emergencia mdica y requieren tratamiento inmediato.  Existen muchos medicamentos que se usan para tratar las convulsiones. Tome los medicamentos de venta libre y los recetados solamente como se lo haya indicado el mdico. Esta informacin no tiene Theme park manager el consejo del mdico. Asegrese de hacerle al mdico cualquier pregunta que tenga. Document Revised: 09/15/2018 Document Reviewed: 09/15/2018 Elsevier Patient Education  2020 [employer].

## 2020-01-29 NOTE — TOC Progression Note (Signed)
Transition of Care [facility]) - Progression Note    Patient Details  Name: Laura Wyatt MRN: [id number] Date of Birth: [birth year]  Transition of Care [facility]) CM/SW Contact  Marina Goodell Phone Number: [phone] 01/29/2020, 12:07 PM  Clinical Narrative:     Patient speaks only Spanish. CSW spoke with patient and gave her information about [facility] and [facility] applications and explained application procedures.  TOC referral was made to [facility] and CSW explained patient will receive a call from [facility] to set up appointment. Patient verbalized understanding.  CSW explained to patient the process for picking up her [facility] and the timeline for her to turn in her application to continue using their services. Patient verbalized understanding.  Patient asked about her tests results and CSW stated she would need to speak to the doctor or RN for the results, but that I would let the RN/CM know about her question.  Patient asked about her son coming to visit and CSW verified visitation policy.  Patient called to come in and visit.  Patient was a little concerned about when she would discharge. Patient stated her son will take her home when she is ready for discharge.  CSW told patient to ask the doctor or nurse but if she has anymore needs to ask for Ginnie RN/CM Or for this CSW.       Expected Discharge Plan and Services                                                 Social Determinants of Health (SDOH) Interventions    Readmission Risk Interventions No flowsheet data found.

## 2020-03-10 ENCOUNTER — Emergency Department (HOSPITAL_COMMUNITY): Payer: Self-pay

## 2020-03-10 ENCOUNTER — Emergency Department (HOSPITAL_COMMUNITY)
Admission: EM | Admit: 2020-03-10 | Discharge: 2020-03-10 | Disposition: A | Discharge status: Home / Self Care | Payer: Self-pay | Attending: Emergency Medicine | Admitting: Emergency Medicine

## 2020-03-10 ENCOUNTER — Encounter (HOSPITAL_COMMUNITY): Payer: Self-pay | Admitting: Emergency Medicine

## 2020-03-10 ENCOUNTER — Other Ambulatory Visit: Payer: Self-pay

## 2020-03-10 DIAGNOSIS — Z20822 Contact with and (suspected) exposure to covid-19: Secondary | ICD-10-CM | POA: Insufficient documentation

## 2020-03-10 DIAGNOSIS — R569 Unspecified convulsions: Secondary | ICD-10-CM | POA: Insufficient documentation

## 2020-03-10 LAB — BASIC METABOLIC PANEL
Anion gap: 11 (ref 5–15)
BUN: 9 mg/dL (ref 6–20)
CO2: 19 mmol/L — ABNORMAL LOW (ref 22–32)
Calcium: 8.6 mg/dL — ABNORMAL LOW (ref 8.9–10.3)
Chloride: 104 mmol/L (ref 98–111)
Creatinine, Ser: 0.64 mg/dL (ref 0.44–1.00)
GFR calc Af Amer: >60 mL/min (ref >60)
GFR calc non Af Amer: >60 mL/min (ref >60)
Glucose, Bld: 115 mg/dL — ABNORMAL HIGH (ref 70–99)
Potassium: 4 mmol/L (ref 3.5–5.1)
Sodium: 134 mmol/L — ABNORMAL LOW (ref 135–145)

## 2020-03-10 LAB — URINALYSIS, ROUTINE W REFLEX MICROSCOPIC
Bilirubin Urine: NEGATIVE
Crystals: NEGATIVE
Glucose, UA: NEGATIVE mg/dL
Hgb urine dipstick: NEGATIVE
Ketones, ur: NEGATIVE mg/dL
Nitrite: NEGATIVE
Protein, ur: 30 mg/dL — AB
Specific Gravity, Urine: 1.013 (ref 1.005–1.030)
pH: 5 (ref 5.0–8.0)

## 2020-03-10 LAB — CBC WITH DIFFERENTIAL/PLATELET
Abs Immature Granulocytes: 0.08 10*3/uL — ABNORMAL HIGH (ref 0.00–0.07)
Basophils Absolute: 0 10*3/uL (ref 0.0–0.1)
Basophils Relative: 0 %
Eosinophils Absolute: 0 10*3/uL (ref 0.0–0.5)
Eosinophils Relative: 0 %
HCT: 35 % — ABNORMAL LOW (ref 36.0–46.0)
Hemoglobin: 12.1 g/dL (ref 12.0–15.0)
Immature Granulocytes: 1 %
Lymphocytes Relative: 6 %
Lymphs Abs: 1.1 10*3/uL (ref 0.7–4.0)
MCH: 28.8 pg (ref 26.0–34.0)
MCHC: 34.6 g/dL (ref 30.0–36.0)
MCV: 83.3 fL (ref 80.0–100.0)
Monocytes Absolute: 0.6 10*3/uL (ref 0.1–1.0)
Monocytes Relative: 3 %
Neutro Abs: 15.2 10*3/uL — ABNORMAL HIGH (ref 1.7–7.7)
Neutrophils Relative %: 90 %
Platelets: 273 10*3/uL (ref 150–400)
RBC: 4.2 MIL/uL (ref 3.87–5.11)
RDW: 14.2 % (ref 11.5–15.5)
WBC: 17 10*3/uL — ABNORMAL HIGH (ref 4.0–10.5)
nRBC: 0 % (ref 0.0–0.2)

## 2020-03-10 LAB — CBG MONITORING, ED: Glucose-Capillary: 110 mg/dL — ABNORMAL HIGH (ref 70–99)

## 2020-03-10 LAB — RAPID URINE DRUG SCREEN, HOSP PERFORMED
Amphetamines: NOT DETECTED
Barbiturates: NOT DETECTED
Benzodiazepines: NOT DETECTED
Cocaine: NOT DETECTED
Opiates: NOT DETECTED
Tetrahydrocannabinol: NOT DETECTED

## 2020-03-10 LAB — RESPIRATORY PANEL BY RT PCR (FLU A&B, COVID)
Influenza A by PCR: NEGATIVE
Influenza B by PCR: NEGATIVE
SARS Coronavirus 2 by RT PCR: NEGATIVE

## 2020-03-10 LAB — I-STAT BETA HCG BLOOD, ED (MC, WL, AP ONLY): I-stat hCG, quantitative: <5 m[IU]/mL (ref <5)

## 2020-03-10 MED ORDER — SODIUM CHLORIDE 0.9 % IV BOLUS
1000.0000 mL | Freq: Once | INTRAVENOUS | Status: AC
  Administered 2020-03-10: 1000 mL via INTRAVENOUS

## 2020-03-10 MED ORDER — LEVETIRACETAM IN NACL 1000 MG/100ML IV SOLN
1000.0000 mg | Freq: Once | INTRAVENOUS | Status: AC
  Administered 2020-03-10: 1000 mg via INTRAVENOUS
  Filled 2020-03-10: qty 100

## 2020-03-10 NOTE — ED Notes (Signed)
Lavender, Light green and blue top sent to lab for hold. Seizure pads on bed. Pt is comfortable and resting.

## 2020-03-10 NOTE — Discharge Instructions (Addendum)
Brain scan was good.  Other tests were also within normal limits.  You must take your Keppra on a daily basis.  Rest in a quiet dark room tonight.  Increase fluids.  Can eat.  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

## 2020-03-10 NOTE — ED Triage Notes (Signed)
Pt BIB [facility] after 3 back to back full body seizures. Unsure if she takes her meds as prescribed. Post ictal upon arrival. 20g LAC. Alert to voice.

## 2020-03-10 NOTE — ED Provider Notes (Addendum)
[facility]-EMERGENCY DEPT Provider Note   CSN: [id number] Arrival date & time: 03/10/20  1414     History Chief Complaint  Patient presents with  . Seizures    Laura Wyatt is a 34 y.o. female.  Level 5 caveat for postictal state.  History per EMS reveals 3 consecutive grand mal seizures.  Patient has known seizure disorder.  Uncertain medication history.  Questionable Keppra.  Other history unavailable.  1700: Further history obtained from patient.  She has not been taking her Keppra lately.        Past Medical History:  Diagnosis Date  . Epilepsy [facility])     Patient Active Problem List   Diagnosis Date Noted  . Recurrent seizures (HCC) 01/28/2020  . Epilepsy (HCC) 01/28/2020  . Non-compliance with treatment 01/28/2020    History reviewed. No pertinent surgical history.   OB History   No obstetric history on file.     History reviewed. No pertinent family history.  Social History   Tobacco Use  . Smoking status: Never Smoker  . Smokeless tobacco: Never Used  Substance Use Topics  . Alcohol use: Never  . Drug use: Never    Home Medications Prior to Admission medications   Medication Sig Start Date End Date Taking? Authorizing Provider  levETIRAcetam (KEPPRA) 1000 MG tablet Take 1,000 mg by mouth 2 (two) times daily. 12/26/19  Yes [provider]    Allergies    Patient has no known allergies.  Review of Systems   Review of Systems  Unable to perform ROS: Mental status change    Physical Exam Updated Vital Signs BP (!) 142/91 (BP Location: Left Arm)   Pulse (!) 101   Temp 98.6 F (37 C) (Oral)   Resp 20   SpO2 96%   Physical Exam Vitals and nursing note reviewed.  Constitutional:      Appearance: She is well-developed.     Comments: Mumbles to command  HENT:     Head: Normocephalic and atraumatic.  Eyes:     Conjunctiva/sclera: Conjunctivae normal.  Cardiovascular:     Rate and Rhythm: Normal rate  and regular rhythm.  Pulmonary:     Effort: Pulmonary effort is normal.     Breath sounds: Normal breath sounds.  Abdominal:     General: Bowel sounds are normal.     Palpations: Abdomen is soft.  Musculoskeletal:        General: Normal range of motion.     Cervical back: Neck supple.  Skin:    General: Skin is warm and dry.  Neurological:     Comments: Can move extremities to command but very sluggishly.  Psychiatric:        Behavior: Behavior normal.     ED Results / Procedures / Treatments   Labs (all labs ordered are listed, but only abnormal results are displayed) Labs Reviewed  CBC WITH DIFFERENTIAL/PLATELET - Abnormal; Notable for the following components:      Result Value   WBC 17.0 (*)    HCT 35.0 (*)    Neutro Abs 15.2 (*)    Abs Immature Granulocytes 0.08 (*)    All other components within normal limits  BASIC METABOLIC PANEL - Abnormal; Notable for the following components:   Sodium 134 (*)    CO2 19 (*)    Glucose, Bld 115 (*)    Calcium 8.6 (*)    All other components within normal limits  CBG MONITORING, ED - Abnormal; Notable for  the following components:   Glucose-Capillary 110 (*)    All other components within normal limits  RESPIRATORY PANEL BY RT PCR (FLU A&B, COVID)  RAPID URINE DRUG SCREEN, HOSP PERFORMED  URINALYSIS, ROUTINE W REFLEX MICROSCOPIC  I-STAT BETA HCG BLOOD, ED ([facility], [facility], [facility] ONLY)    EKG None  Radiology CT Head Wo Contrast  Result Date: 03/10/2020 CLINICAL DATA:  seizure EXAM: CT HEAD WITHOUT CONTRAST TECHNIQUE: Contiguous axial images were obtained from the base of the skull through the vertex without intravenous contrast. COMPARISON:  CT head 01/28/2020 FINDINGS: Brain: No evidence of acute infarction, hemorrhage, hydrocephalus, extra-axial collection or mass lesion/mass effect. Redemonstrated cerebellar atrophy. Vascular: No hyperdense vessel or unexpected calcification. Skull: Normal. Negative for fracture or focal lesion.  Sinuses/Orbits: No acute finding. Other: None. IMPRESSION: No acute intracranial pathology. Electronically Signed   By: Emmaline Kluver M.D.   On: 03/10/2020 17:43    Procedures Procedures (including critical care time)  Medications Ordered in ED Medications  sodium chloride 0.9 % bolus 1,000 mL (0 mLs Intravenous Stopped 03/10/20 1624)  levETIRAcetam (KEPPRA) IVPB 1000 mg/100 mL premix (0 mg Intravenous Stopped 03/10/20 1624)    ED Course  I have reviewed the triage vital signs and the nursing notes.  Pertinent labs & imaging results that were available during my care of the patient were reviewed by me and considered in my medical decision making (see chart for details).    MDM Rules/Calculators/A&P                          EMS notes suggestive of seizure.  Will obtain CT head, basic labs, glucose, load with Keppra.  Patient rechecked several times during her hospital course.  She became increasingly alert over time.  She admitted to not taking her Keppra.  Discussed with her son.  He states that he has her prescription now and will restart it.  Patient stable for discharge  CRITICAL CARE Performed by: Donnetta Hutching Total critical care time: 30 minutes Critical care time was exclusive of separately billable procedures and treating other patients. Critical care was necessary to treat or prevent imminent or life-threatening deterioration. Critical care was time spent personally by me on the following activities: development of treatment plan with patient and/or surrogate as well as nursing, discussions with consultants, evaluation of patient's response to treatment, examination of patient, obtaining history from patient or surrogate, ordering and performing treatments and interventions, ordering and review of laboratory studies, ordering and review of radiographic studies, pulse oximetry and re-evaluation of patient's condition. Final Clinical Impression(s) / ED Diagnoses Final diagnoses:   Seizure [facility])    Rx / DC Orders ED Discharge Orders    None       Donnetta Hutching, MD 03/10/20 1536    Donnetta Hutching, MD 03/10/20 [phone]

## 2020-05-22 ENCOUNTER — Other Ambulatory Visit: Payer: Self-pay

## 2020-05-22 ENCOUNTER — Emergency Department
Admission: EM | Admit: 2020-05-22 | Discharge: 2020-05-22 | Disposition: A | Discharge status: Home / Self Care | Payer: Self-pay | Attending: Emergency Medicine | Admitting: Emergency Medicine

## 2020-05-22 DIAGNOSIS — R569 Unspecified convulsions: Secondary | ICD-10-CM | POA: Insufficient documentation

## 2020-05-22 DIAGNOSIS — G40909 Epilepsy, unspecified, not intractable, without status epilepticus: Secondary | ICD-10-CM

## 2020-05-22 DIAGNOSIS — N3090 Cystitis, unspecified without hematuria: Secondary | ICD-10-CM

## 2020-05-22 DIAGNOSIS — E860 Dehydration: Secondary | ICD-10-CM

## 2020-05-22 LAB — COMPREHENSIVE METABOLIC PANEL
ALT: 38 U/L (ref 0–44)
AST: 54 U/L — ABNORMAL HIGH (ref 15–41)
Albumin: 4.1 g/dL (ref 3.5–5.0)
Alkaline Phosphatase: 93 U/L (ref 38–126)
Anion gap: 12 (ref 5–15)
BUN: 13 mg/dL (ref 6–20)
CO2: 20 mmol/L — ABNORMAL LOW (ref 22–32)
Calcium: 8.6 mg/dL — ABNORMAL LOW (ref 8.9–10.3)
Chloride: 105 mmol/L (ref 98–111)
Creatinine, Ser: 0.81 mg/dL (ref 0.44–1.00)
GFR, Estimated: >60 mL/min (ref >60)
Glucose, Bld: 122 mg/dL — ABNORMAL HIGH (ref 70–99)
Potassium: 4 mmol/L (ref 3.5–5.1)
Sodium: 137 mmol/L (ref 135–145)
Total Bilirubin: 0.6 mg/dL (ref 0.3–1.2)
Total Protein: 8 g/dL (ref 6.5–8.1)

## 2020-05-22 LAB — HCG, QUANTITATIVE, PREGNANCY: hCG, Beta Chain, Quant, S: <1 m[IU]/mL (ref <5)

## 2020-05-22 LAB — CBC WITH DIFFERENTIAL/PLATELET
Abs Immature Granulocytes: 0.05 10*3/uL (ref 0.00–0.07)
Basophils Absolute: 0 10*3/uL (ref 0.0–0.1)
Basophils Relative: 1 %
Eosinophils Absolute: 0.1 10*3/uL (ref 0.0–0.5)
Eosinophils Relative: 1 %
HCT: 34.5 % — ABNORMAL LOW (ref 36.0–46.0)
Hemoglobin: 12 g/dL (ref 12.0–15.0)
Immature Granulocytes: 1 %
Lymphocytes Relative: 18 %
Lymphs Abs: 1.6 10*3/uL (ref 0.7–4.0)
MCH: 29.3 pg (ref 26.0–34.0)
MCHC: 34.8 g/dL (ref 30.0–36.0)
MCV: 84.1 fL (ref 80.0–100.0)
Monocytes Absolute: 0.5 10*3/uL (ref 0.1–1.0)
Monocytes Relative: 6 %
Neutro Abs: 6.7 10*3/uL (ref 1.7–7.7)
Neutrophils Relative %: 73 %
Platelets: 345 10*3/uL (ref 150–400)
RBC: 4.1 MIL/uL (ref 3.87–5.11)
RDW: 12.1 % (ref 11.5–15.5)
WBC: 8.9 10*3/uL (ref 4.0–10.5)
nRBC: 0 % (ref 0.0–0.2)

## 2020-05-22 LAB — URINALYSIS, ROUTINE W REFLEX MICROSCOPIC
Bilirubin Urine: NEGATIVE
Glucose, UA: NEGATIVE mg/dL
Hgb urine dipstick: NEGATIVE
Ketones, ur: NEGATIVE mg/dL
Nitrite: NEGATIVE
Protein, ur: NEGATIVE mg/dL
Specific Gravity, Urine: 1.018 (ref 1.005–1.030)
pH: 5 (ref 5.0–8.0)

## 2020-05-22 LAB — URINE DRUG SCREEN, QUALITATIVE (ARMC ONLY)
Amphetamines, Ur Screen: NOT DETECTED
Barbiturates, Ur Screen: NOT DETECTED
Benzodiazepine, Ur Scrn: NOT DETECTED
Cannabinoid 50 Ng, Ur ~~LOC~~: NOT DETECTED
Cocaine Metabolite,Ur ~~LOC~~: NOT DETECTED
MDMA (Ecstasy)Ur Screen: NOT DETECTED
Methadone Scn, Ur: NOT DETECTED
Opiate, Ur Screen: NOT DETECTED
Phencyclidine (PCP) Ur S: NOT DETECTED
Tricyclic, Ur Screen: NOT DETECTED

## 2020-05-22 LAB — CK: Total CK: 288 U/L — ABNORMAL HIGH (ref 38–234)

## 2020-05-22 LAB — SALICYLATE LEVEL: Salicylate Lvl: <7 mg/dL — ABNORMAL LOW (ref 7.0–30.0)

## 2020-05-22 LAB — CBG MONITORING, ED: Glucose-Capillary: 133 mg/dL — ABNORMAL HIGH (ref 70–99)

## 2020-05-22 LAB — MAGNESIUM: Magnesium: 2.3 mg/dL (ref 1.7–2.4)

## 2020-05-22 LAB — LACTIC ACID, PLASMA
Lactic Acid, Venous: >11 mmol/L (ref 0.5–1.9)
Lactic Acid, Venous: 2.7 mmol/L (ref 0.5–1.9)

## 2020-05-22 LAB — ACETAMINOPHEN LEVEL: Acetaminophen (Tylenol), Serum: <10 ug/mL — ABNORMAL LOW (ref 10–30)

## 2020-05-22 LAB — ETHANOL: Alcohol, Ethyl (B): <10 mg/dL (ref <10)

## 2020-05-22 MED ORDER — NITROFURANTOIN MACROCRYSTAL 100 MG PO CAPS: 100.0000 mg | ORAL_CAPSULE | Freq: Two times a day (BID) | ORAL | 0 refills | Status: DC

## 2020-05-22 MED ORDER — SODIUM CHLORIDE 0.9 % IV SOLN: INTRAVENOUS | Status: DC

## 2020-05-22 MED ORDER — LEVETIRACETAM IN NACL 1500 MG/100ML IV SOLN
1500.0000 mg | Freq: Once | INTRAVENOUS | Status: AC
  Administered 2020-05-22: 07:00:00 1500 mg via INTRAVENOUS
  Filled 2020-05-22: qty 100

## 2020-05-22 MED ORDER — SODIUM CHLORIDE 0.9 % IV BOLUS
1000.0000 mL | Freq: Once | INTRAVENOUS | Status: AC
  Administered 2020-05-22: 07:00:00 1000 mL via INTRAVENOUS

## 2020-05-22 MED ORDER — LORAZEPAM 2 MG/ML IJ SOLN
INTRAMUSCULAR | Status: AC
  Administered 2020-05-22: 07:00:00 2 mg via INTRAVENOUS
  Filled 2020-05-22: qty 1

## 2020-05-22 MED ORDER — FOSFOMYCIN TROMETHAMINE 3 G PO PACK
3.0000 g | PACK | Freq: Once | ORAL | Status: AC
  Administered 2020-05-22: 15:00:00 3 g via ORAL
  Filled 2020-05-22: qty 3

## 2020-05-22 MED ORDER — LEVETIRACETAM 500 MG PO TABS
1000.0000 mg | ORAL_TABLET | Freq: Once | ORAL | Status: AC
  Administered 2020-05-22: 15:00:00 1000 mg via ORAL
  Filled 2020-05-22: qty 2

## 2020-05-22 MED ORDER — LEVETIRACETAM 1000 MG PO TABS: 1000.0000 mg | ORAL_TABLET | Freq: Two times a day (BID) | ORAL | 1 refills | Status: DC

## 2020-05-22 MED ORDER — LORAZEPAM 2 MG/ML IJ SOLN: 2.0000 mg | Freq: Once | INTRAMUSCULAR | Status: AC

## 2020-05-22 NOTE — ED Notes (Addendum)
Patient began having seizure like activity with eyes straining open, body tensing and flopping. Suction set up, pt placed on 2L nasal cannula and rolled onto R side. MD York Cerise at bedside. See new orders and eMAR.

## 2020-05-22 NOTE — ED Notes (Signed)
MD stafford made aware of lactic acid >11.0

## 2020-05-22 NOTE — ED Notes (Signed)
Patient is resting comfortably. Tolerating oral fluids.

## 2020-05-22 NOTE — ED Notes (Signed)
Patient is resting comfortably. 

## 2020-05-22 NOTE — Discharge Instructions (Signed)
Take the antibiotic as prescribed for the urinary tract infection in your bladder.   Be sure to obtain the seizure medicine (Keppra) and take it two times every day to prevent more seizures. Please follow up with neurology to continue monitoring your epilepsy.

## 2020-05-22 NOTE — ED Triage Notes (Signed)
Pt arrives [facility] from home w cc of seizures. Son reported about 5 seizures, one every hour tonight. Pt was post dictal and slightly combative w EMS. Per son pt takes keppra regularly. Hx seizures

## 2020-05-22 NOTE — ED Provider Notes (Signed)
Procedures  Clinical Course as of 05/22/20 0953  Wed May 22, 2020  7591 We heard an unusual noise coming from the room and her nurse I responded to find that she was having a generalized tonic-clonic seizure, foaming at the mouth, unresponsive to external stimuli.  The staff and I turned her on her side and suctioned and applied a nasal cannula for oxygen.  She seemed to be coming out of the seizure by the time we got medications but given the recurrent nature of her seizures I ordered Ativan 2 mg IV.  We are awaiting the Keppra bolus.  Seizure precautions remain in place.  She is currently postictal and protecting her airway. [CF]  [id number] Glucose-Capillary(!): 133 [CF]  [id number] Transferring ED care at 7 AM to Dr. Scotty Court to follow-up on labs and disposition the patient appropriately.  Anticipate the need for admission given the recurrent seizures including the one witnessed in the emergency department. [CF]    Clinical Course User Index [CF] Laura Rose, MD    ----------------------------------------- 9:53 AM on 05/22/2020 ----------------------------------------- Patient sleeping but arousable. Complains of some left neck pain, but otherwise no complaints. Feels better currently. States that she has been out of her seizure medicine for the past year. She has received a Keppra bolus and is currently getting IV fluids for hydration. We will continue to monitor in the ED.  ----------------------------------------- 11:33 AM on 05/22/2020 -----------------------------------------  Initial lactate was greater than 11 which is due to seizure and not sepsis.  Repeat lactate is 2.7.  Patient has tachycardia which is improving with IV fluids.  Will p.o. trial.  ----------------------------------------- 2:42 PM on 05/22/2020 -----------------------------------------  Patient is tolerating p.o.  Feels better and wants to go home.  Tachycardia continues improving.  Other vital signs remain normal.  No  further seizures in the ED after receiving IV Keppra, and she is alert and lucid.  Patient has been able to be discharged home in the past with similar presentations related to medication noncompliance, and I think this would be appropriate today.  Urinalysis does show evidence of UTI, UDS is negative.  Will give fosfomycin and a 3-day course of Macrobid, prescription for Keppra with refill, follow-up with neurology.    Sharman Cheek, MD 05/22/20 1444

## 2020-05-22 NOTE — ED Provider Notes (Signed)
[facility] Emergency Department Provider Note  ____________________________________________   Event Date/Time   First MD Initiated Contact with Patient 05/22/20 [phone]     (approximate)  I have reviewed the triage vital signs and the nursing notes.   HISTORY  Chief Complaint Seizures  Level 5 caveat:  history/ROS limited by acute/critical illness  HPI Laura Wyatt is a 35 y.o. female with a history of recurrent seizures and noncompliance with her Keppra at home.  She has been admitted to [facility]  and other hospitals within the [facility] system multiple times for persistent seizures in the setting of not taking her Keppra.  She presents this morning by EMS for evaluation of multiple seizures throughout the night.  Her son reported to the paramedics that she had at least 5 seizures during the night.  He finally called EMS at about 6:15 AM after her last 1.  EMS did not witness any seizure-like activity but stated that she was post ictal.  She became combative (which has also been reported multiple times in the past) but they did not have to give her any medication.  She is somnolent but responds to verbal instructions and questions upon initial evaluation.        Past Medical History:  Diagnosis Date  . Epilepsy [facility])     Patient Active Problem List   Diagnosis Date Noted  . Recurrent seizures (HCC) 01/28/2020  . Epilepsy (HCC) 01/28/2020  . Non-compliance with treatment 01/28/2020    History reviewed. No pertinent surgical history.  Prior to Admission medications   Medication Sig Start Date End Date Taking? Authorizing Provider  levETIRAcetam (KEPPRA) 1000 MG tablet Take 1,000 mg by mouth 2 (two) times daily. 12/26/19   [provider]    Allergies Patient has no known allergies.  No family history on file.  Social History Social History   Tobacco Use  . Smoking status: Never Smoker  . Smokeless tobacco: Never Used  Substance Use  Topics  . Alcohol use: Never  . Drug use: Never    Review of Systems Level 5 caveat:  history/ROS limited by acute/critical illness    ____________________________________________   PHYSICAL EXAM:  VITAL SIGNS: ED Triage Vitals  Enc Vitals Group     BP 05/22/20 0621 112/70     Pulse Rate 05/22/20 0621 (!) 115     Resp 05/22/20 0621 (!) 25     Temp 05/22/20 0621 98.7 F (37.1 C)     Temp Source 05/22/20 0621 Oral     SpO2 05/22/20 0621 95 %     Weight 05/22/20 0618 87.4 kg (192 lb 10.9 oz)     Height 05/22/20 0618 1.524 m (5')     Head Circumference --      Peak Flow --      Pain Score --      Pain Loc --      Pain Edu? --      Excl. in GC? --     Constitutional: Somnolent but awakens to voice and light touch. Eyes: Conjunctivae are normal.  Head: Atraumatic. Nose: No congestion/rhinnorhea. Mouth/Throat: Patient is wearing a mask. Neck: No stridor.  No meningeal signs.   Cardiovascular: Tachycardia, regular rhythm. Good peripheral circulation. Respiratory: Normal respiratory effort.  No retractions. Gastrointestinal: Soft and nontender. No distention.  Musculoskeletal: No lower extremity tenderness nor edema. No gross deformities of extremities. Neurologic:  Normal speech and language. No gross focal neurologic deficits are appreciated but exam is limited due  to postictal state. Skin:  Skin is warm, dry and intact.   ____________________________________________   LABS (all labs ordered are listed, but only abnormal results are displayed)  Labs Reviewed  CBG MONITORING, ED - Abnormal; Notable for the following components:      Result Value   Glucose-Capillary 133 (*)    All other components within normal limits  CBC WITH DIFFERENTIAL/PLATELET  COMPREHENSIVE METABOLIC PANEL  MAGNESIUM  ACETAMINOPHEN LEVEL  SALICYLATE LEVEL  ETHANOL  URINE DRUG SCREEN, QUALITATIVE ([facility] ONLY)  URINALYSIS, ROUTINE W REFLEX MICROSCOPIC  HCG, QUANTITATIVE, PREGNANCY  LACTIC  ACID, PLASMA  LACTIC ACID, PLASMA  CK  CBG MONITORING, ED  POC URINE PREG, ED   ____________________________________________  EKG  ED ECG REPORT I, Loleta Rose, the attending physician, personally viewed and interpreted this ECG.  Date: 05/22/2020 EKG Time: 6:19 AM Rate: 122 Rhythm: Sinus tachycardia QRS Axis: normal Intervals: normal without any concerning widening or prolongation of QRS complex nor QTc interval ST/T Wave abnormalities: No significant ST segment or T wave changes that would suggest acute ischemia Narrative Interpretation: no definitive evidence of acute ischemia; does not meet STEMI criteria.   ____________________________________________  RADIOLOGY I, Loleta Rose, personally viewed and evaluated these images (plain radiographs) as part of my medical decision making, as well as reviewing the written report by the radiologist.  ED MD interpretation: No indication for emergent imaging at this time  Official radiology report(s): No results found.  ____________________________________________   PROCEDURES   Procedure(s) performed (including Critical Care):  .Critical Care Performed by: Loleta Rose, MD Authorized by: Loleta Rose, MD   Critical care provider statement:    Critical care time (minutes):  30   Critical care time was exclusive of:  Separately billable procedures and treating other patients   Critical care was necessary to treat or prevent imminent or life-threatening deterioration of the following conditions:  CNS failure or compromise   Critical care was time spent personally by me on the following activities:  Development of treatment plan with patient or surrogate, discussions with consultants, evaluation of patient's response to treatment, examination of patient, obtaining history from patient or surrogate, ordering and performing treatments and interventions, ordering and review of laboratory studies, ordering and review of  radiographic studies, pulse oximetry, re-evaluation of patient's condition and review of old charts     ____________________________________________   INITIAL IMPRESSION / MDM / ASSESSMENT AND PLAN / ED COURSE  As part of my medical decision making, I reviewed the following data within the electronic MEDICAL RECORD NUMBER Nursing notes reviewed and incorporated, Labs reviewed , EKG interpreted , Old chart reviewed, Patient signed out to Dr. Scotty Court and Notes from prior ED visits   Differential diagnosis includes, but is not limited to, recurrent seizures, medication noncompliance, status epilepticus, pseudoseizure or nonepileptiform seizure, electrolyte or metabolic abnormality, rhabdomyolysis, lactic acidosis, medication or drug side effect.  The patient is on the cardiac monitor to evaluate for evidence of arrhythmia and/or significant heart rate changes.  The patient was able to answer some simple questions for me.  She said that her head hurts but she has not had a fall recently.  She denies chest pain, shortness of breath, and abdominal pain.  She says she has some nausea.  She appears postictal but is protecting her airway and is not in acute distress at this moment.  We will initiate seizure precautions and standard seizure work-up including labs and a loading dose of Keppra 1.5 g IV.  In the  past it appears that she typically does well after loading dose of Keppra but she frequently has multiple seizures back to back.  Disposition will depend somewhat on her overall status after treatment and on her evaluation.  In the past she has been admitted multiple times but has also been discharged from the ED after work-up and loading dose of Keppra.  Given her history with prior CT scans and MRIs that have all been unremarkable, and no recent history of trauma no evidence of trauma on physical exam, I do not think she would benefit from emergent CT scan.  Additionally I think she is at high risk of  having a seizure and it would be dangerous for her to seize while in the scanner.     Clinical Course as of 05/22/20 0651  Wed May 22, 2020  1017 We heard an unusual noise coming from the room and her nurse I responded to find that she was having a generalized tonic-clonic seizure, foaming at the mouth, unresponsive to external stimuli.  The staff and I turned her on her side and suctioned and applied a nasal cannula for oxygen.  She seemed to be coming out of the seizure by the time we got medications but given the recurrent nature of her seizures I ordered Ativan 2 mg IV.  We are awaiting the Keppra bolus.  Seizure precautions remain in place.  She is currently postictal and protecting her airway. [CF]  [id number] Glucose-Capillary(!): 133 [CF]  [id number] Transferring ED care at 7 AM to Dr. Scotty Court to follow-up on labs and disposition the patient appropriately.  Anticipate the need for admission given the recurrent seizures including the one witnessed in the emergency department. [CF]    Clinical Course User Index [CF] Loleta Rose, MD     ____________________________________________  FINAL CLINICAL IMPRESSION(S) / ED DIAGNOSES  Final diagnoses:  Recurrent seizures (HCC)     MEDICATIONS GIVEN DURING THIS VISIT:  Medications  sodium chloride 0.9 % bolus 1,000 mL (1,000 mLs Intravenous New Bag/Given 05/22/20 0640)    And  0.9 %  sodium chloride infusion (has no administration in time range)  levETIRAcetam (KEPPRA) IVPB 1500 mg/ 100 mL premix (1,500 mg Intravenous New Bag/Given 05/22/20 0644)  LORazepam (ATIVAN) injection 2 mg (2 mg Intravenous Given 05/22/20 5102)     ED Discharge Orders    None      *Please note:  Sally Menard was evaluated in Emergency Department on 05/22/2020 for the symptoms described in the history of present illness. She was evaluated in the context of the global COVID-19 pandemic, which necessitated consideration that the patient might be at risk for  infection with the SARS-CoV-2 virus that causes COVID-19. Institutional protocols and algorithms that pertain to the evaluation of patients at risk for COVID-19 are in a state of rapid change based on information released by regulatory bodies including the CDC and federal and state organizations. These policies and algorithms were followed during the patient's care in the ED.  Some ED evaluations and interventions may be delayed as a result of limited staffing during and after the pandemic.*  Note:  This document was prepared using Dragon voice recognition software and may include unintentional dictation errors.   Loleta Rose, MD 05/22/20 [phone]

## 2021-02-24 IMAGING — CT CT HEAD W/O CM
2 series · 15 of 37 positions shown, 18 images · non-contrast
Comparison: CT head 01/28/2020

CLINICAL DATA: seizure

EXAM:
CT HEAD WITHOUT CONTRAST
TECHNIQUE: Contiguous axial images were obtained from the base of the skull
through the vertex without intravenous contrast.

[Series 2: head wo · axial · 0.47mm/px · z∈[+1312,+1447]mm · 12 of 33 slices shown, 15 images]
[im 3/33  brain]
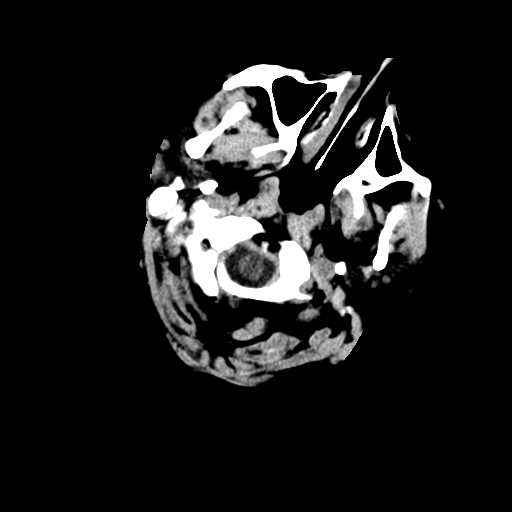
[im 3/33  bone]
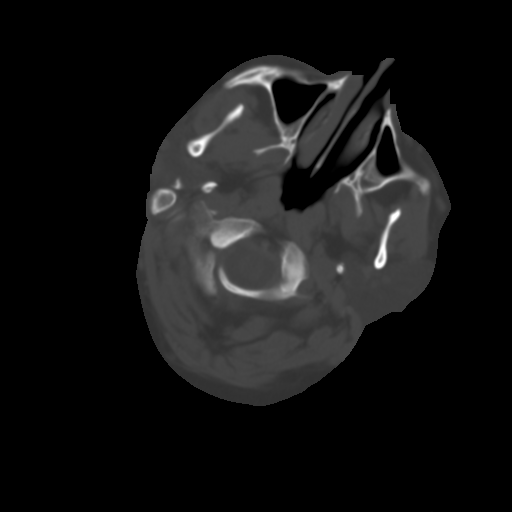
[im 5/33  brain]
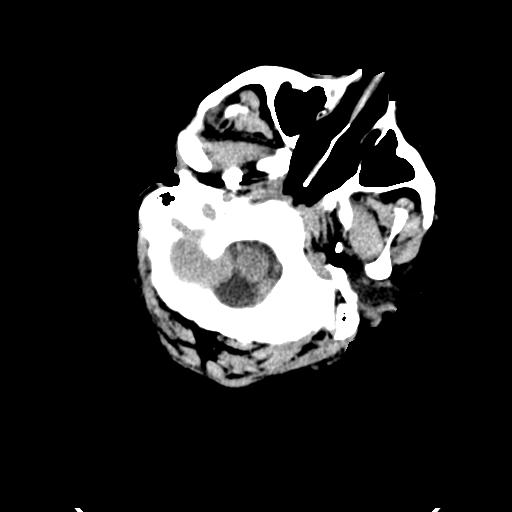
[im 7/33  brain]
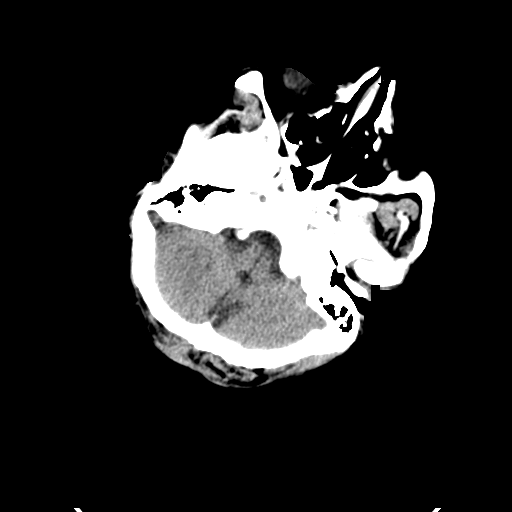
[im 10/33  brain]
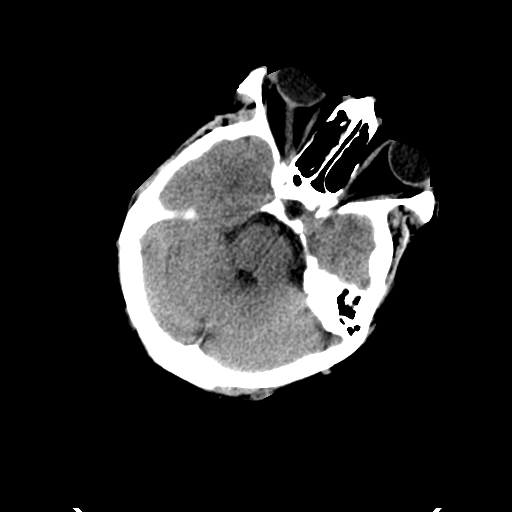
[im 13/33  brain]
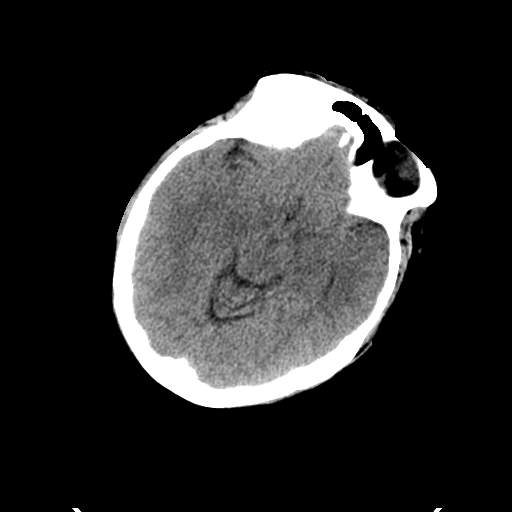
[im 13/33  bone]
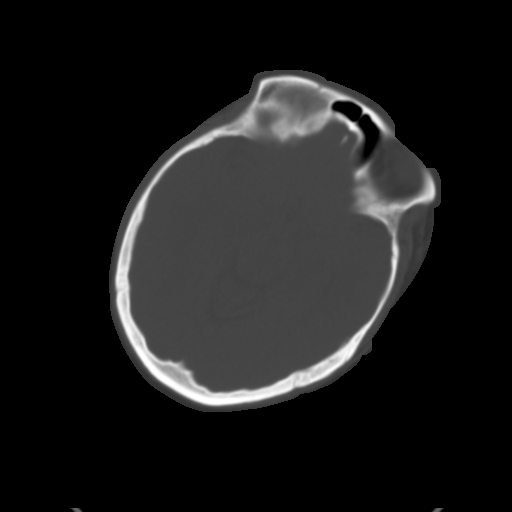
[im 15/33  brain]
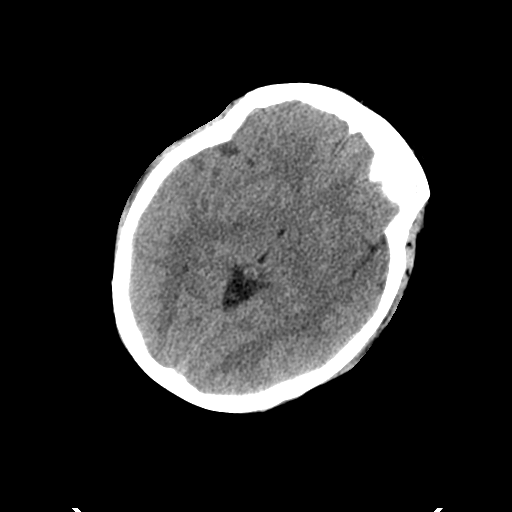
[im 18/33  brain]
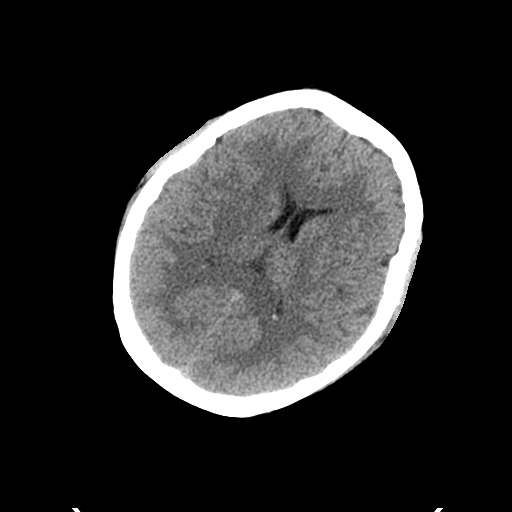
[im 20/33  brain]
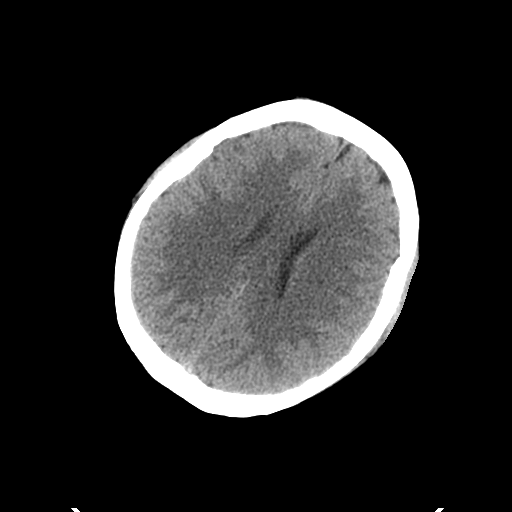
[im 23/33  brain]
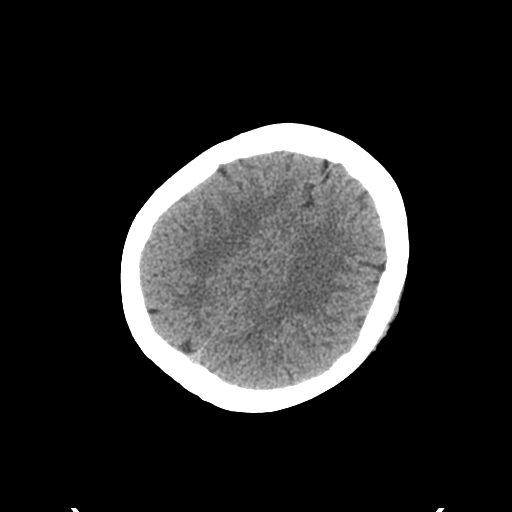
[im 23/33  bone]
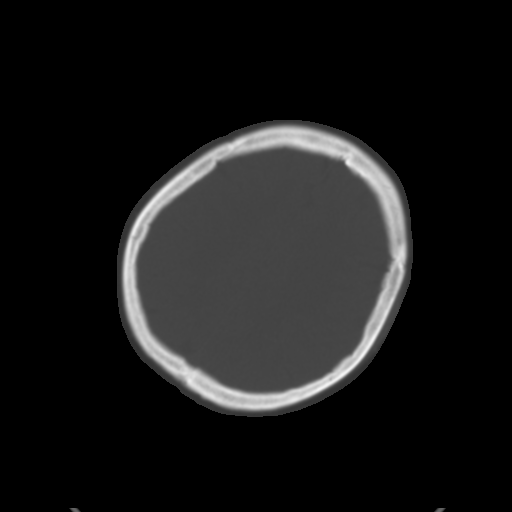
[im 26/33  brain]
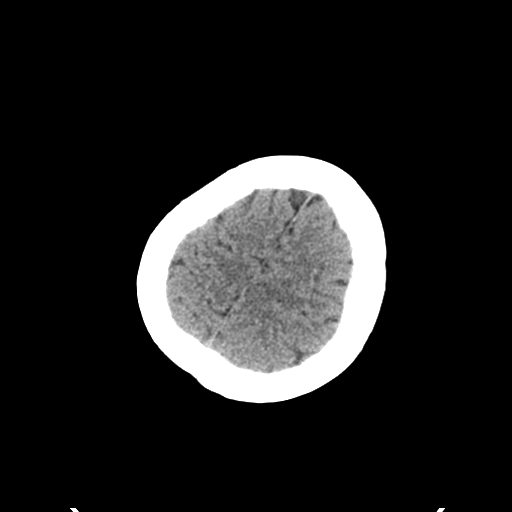
[im 28/33  brain]
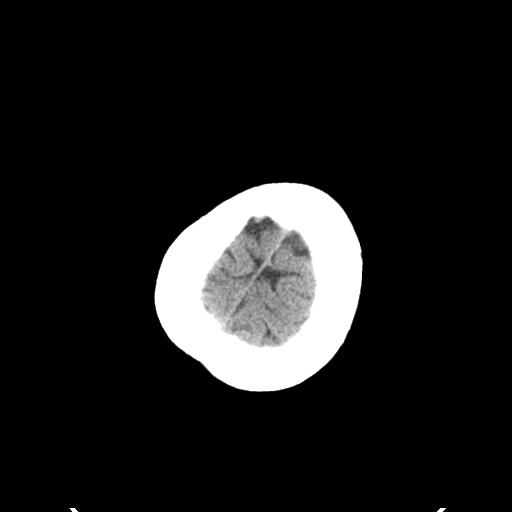
[im 30/33  brain]
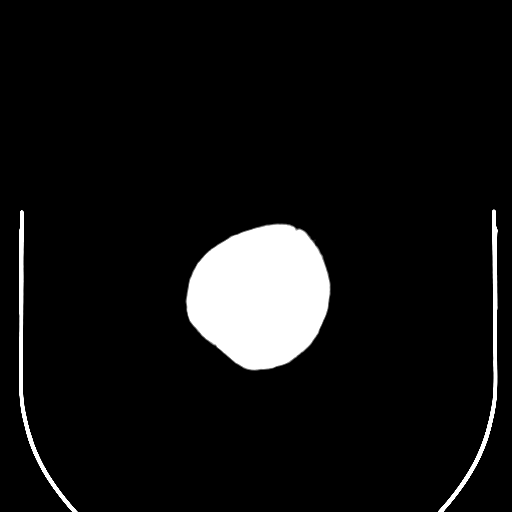

[Series 6: sagittal soft tissue · sagittal · 0.38mm/px · 3 of 54 slices shown]
[im 18/54  brain]
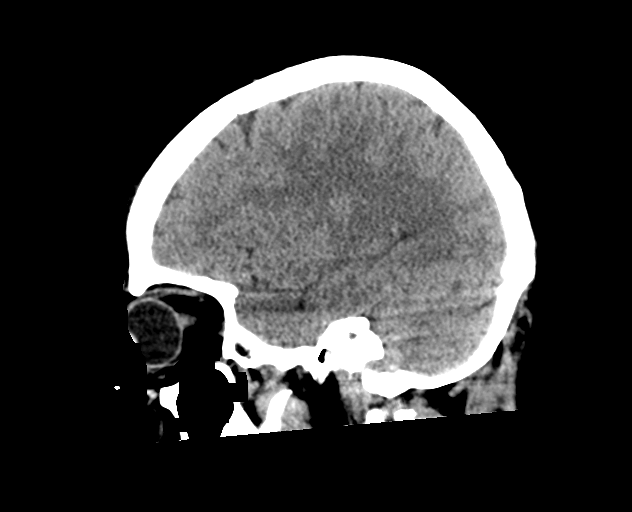
[im 27/54  brain]
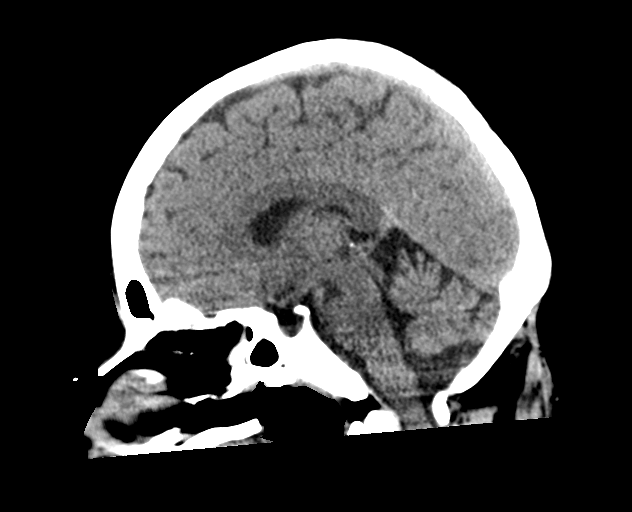
[im 36/54  brain]
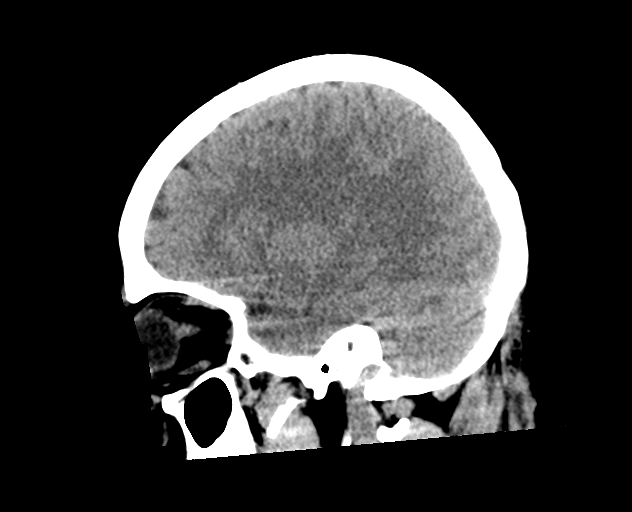

[15 of 37 positions shown; findings below may reference images not displayed]

FINDINGS: Brain: No evidence of acute infarction, hemorrhage, hydrocephalus,
extra-axial collection or mass lesion/mass effect. Redemonstrated
cerebellar atrophy.

Vascular: No hyperdense vessel or unexpected calcification.

Skull: Normal. Negative for fracture or focal lesion.

Sinuses/Orbits: No acute finding.

Other: None.
IMPRESSION: No acute intracranial pathology.

## 2021-02-27 ENCOUNTER — Emergency Department (HOSPITAL_COMMUNITY): Payer: Self-pay

## 2021-02-27 ENCOUNTER — Inpatient Hospital Stay (HOSPITAL_COMMUNITY)
Admission: EM | Admit: 2021-02-27 | Discharge: 2021-03-04 | DRG: 982 | Disposition: A | Discharge status: Home / Self Care | Payer: Self-pay | Attending: General Surgery | Admitting: General Surgery

## 2021-02-27 DIAGNOSIS — S01512A Laceration without foreign body of oral cavity, initial encounter: Secondary | ICD-10-CM | POA: Diagnosis present

## 2021-02-27 DIAGNOSIS — S27892A Contusion of other specified intrathoracic organs, initial encounter: Principal | ICD-10-CM | POA: Diagnosis present

## 2021-02-27 DIAGNOSIS — G40409 Other generalized epilepsy and epileptic syndromes, not intractable, without status epilepticus: Secondary | ICD-10-CM | POA: Diagnosis present

## 2021-02-27 DIAGNOSIS — W109XXA Fall (on) (from) unspecified stairs and steps, initial encounter: Secondary | ICD-10-CM | POA: Diagnosis present

## 2021-02-27 DIAGNOSIS — S22009A Unspecified fracture of unspecified thoracic vertebra, initial encounter for closed fracture: Secondary | ICD-10-CM | POA: Diagnosis present

## 2021-02-27 DIAGNOSIS — R079 Chest pain, unspecified: Secondary | ICD-10-CM

## 2021-02-27 DIAGNOSIS — Z79899 Other long term (current) drug therapy: Secondary | ICD-10-CM

## 2021-02-27 DIAGNOSIS — S22059A Unspecified fracture of T5-T6 vertebra, initial encounter for closed fracture: Secondary | ICD-10-CM | POA: Diagnosis present

## 2021-02-27 DIAGNOSIS — R569 Unspecified convulsions: Secondary | ICD-10-CM

## 2021-02-27 DIAGNOSIS — M545 Low back pain, unspecified: Secondary | ICD-10-CM

## 2021-02-27 DIAGNOSIS — Z419 Encounter for procedure for purposes other than remedying health state, unspecified: Secondary | ICD-10-CM

## 2021-02-27 DIAGNOSIS — Z20822 Contact with and (suspected) exposure to covid-19: Secondary | ICD-10-CM | POA: Diagnosis present

## 2021-02-27 DIAGNOSIS — W19XXXA Unspecified fall, initial encounter: Secondary | ICD-10-CM

## 2021-02-27 DIAGNOSIS — S22062A Unstable burst fracture of T7-T8 vertebra, initial encounter for closed fracture: Secondary | ICD-10-CM | POA: Diagnosis present

## 2021-02-27 DIAGNOSIS — W11XXXA Fall on and from ladder, initial encounter: Secondary | ICD-10-CM | POA: Diagnosis present

## 2021-02-27 LAB — I-STAT CHEM 8, ED
BUN: 10 mg/dL (ref 6–20)
Calcium, Ion: 1.15 mmol/L (ref 1.15–1.40)
Chloride: 106 mmol/L (ref 98–111)
Creatinine, Ser: 0.6 mg/dL (ref 0.44–1.00)
Glucose, Bld: 185 mg/dL — ABNORMAL HIGH (ref 70–99)
HCT: 31 % — ABNORMAL LOW (ref 36.0–46.0)
Hemoglobin: 10.5 g/dL — ABNORMAL LOW (ref 12.0–15.0)
Potassium: 3.8 mmol/L (ref 3.5–5.1)
Sodium: 139 mmol/L (ref 135–145)
TCO2: 20 mmol/L — ABNORMAL LOW (ref 22–32)

## 2021-02-27 LAB — HIV ANTIBODY (ROUTINE TESTING W REFLEX): HIV Screen 4th Generation wRfx: NONREACTIVE

## 2021-02-27 LAB — COMPREHENSIVE METABOLIC PANEL
ALT: 52 U/L — ABNORMAL HIGH (ref 0–44)
AST: 129 U/L — ABNORMAL HIGH (ref 15–41)
Albumin: 3.6 g/dL (ref 3.5–5.0)
Alkaline Phosphatase: 81 U/L (ref 38–126)
Anion gap: 12 (ref 5–15)
BUN: 10 mg/dL (ref 6–20)
CO2: 19 mmol/L — ABNORMAL LOW (ref 22–32)
Calcium: 8.9 mg/dL (ref 8.9–10.3)
Chloride: 105 mmol/L (ref 98–111)
Creatinine, Ser: 0.82 mg/dL (ref 0.44–1.00)
GFR, Estimated: >60 mL/min (ref >60)
Glucose, Bld: 164 mg/dL — ABNORMAL HIGH (ref 70–99)
Potassium: 3.5 mmol/L (ref 3.5–5.1)
Sodium: 136 mmol/L (ref 135–145)
Total Bilirubin: 0.4 mg/dL (ref 0.3–1.2)
Total Protein: 7.2 g/dL (ref 6.5–8.1)

## 2021-02-27 LAB — PROTIME-INR
INR: 0.9 (ref 0.8–1.2)
Prothrombin Time: 12.2 seconds (ref 11.4–15.2)

## 2021-02-27 LAB — SAMPLE TO BLOOD BANK

## 2021-02-27 LAB — MAGNESIUM: Magnesium: 1.9 mg/dL (ref 1.7–2.4)

## 2021-02-27 LAB — CBC
HCT: 30.9 % — ABNORMAL LOW (ref 36.0–46.0)
Hemoglobin: 9.6 g/dL — ABNORMAL LOW (ref 12.0–15.0)
MCH: 24.8 pg — ABNORMAL LOW (ref 26.0–34.0)
MCHC: 31.1 g/dL (ref 30.0–36.0)
MCV: 79.8 fL — ABNORMAL LOW (ref 80.0–100.0)
Platelets: 380 10*3/uL (ref 150–400)
RBC: 3.87 MIL/uL (ref 3.87–5.11)
RDW: 14.5 % (ref 11.5–15.5)
WBC: 23 10*3/uL — ABNORMAL HIGH (ref 4.0–10.5)
nRBC: 0 % (ref 0.0–0.2)

## 2021-02-27 LAB — RESP PANEL BY RT-PCR (FLU A&B, COVID) ARPGX2
Influenza A by PCR: NEGATIVE
Influenza B by PCR: NEGATIVE
SARS Coronavirus 2 by RT PCR: NEGATIVE

## 2021-02-27 LAB — PHOSPHORUS: Phosphorus: 4.4 mg/dL (ref 2.5–4.6)

## 2021-02-27 LAB — CK: Total CK: 668 U/L — ABNORMAL HIGH (ref 38–234)

## 2021-02-27 LAB — LACTIC ACID, PLASMA: Lactic Acid, Venous: 4 mmol/L (ref 0.5–1.9)

## 2021-02-27 LAB — MRSA NEXT GEN BY PCR, NASAL: MRSA by PCR Next Gen: NOT DETECTED

## 2021-02-27 LAB — ETHANOL: Alcohol, Ethyl (B): <10 mg/dL (ref <10)

## 2021-02-27 LAB — ABO/RH: ABO/RH(D): O POS

## 2021-02-27 MED ORDER — HYDROMORPHONE HCL 1 MG/ML IJ SOLN
1.0000 mg | INTRAMUSCULAR | Status: DC | PRN
  Administered 2021-02-27: 1 mg via INTRAVENOUS
  Filled 2021-02-27: qty 1

## 2021-02-27 MED ORDER — DIPHENHYDRAMINE HCL 50 MG/ML IJ SOLN: 12.5000 mg | Freq: Four times a day (QID) | INTRAMUSCULAR | Status: DC | PRN

## 2021-02-27 MED ORDER — DIPHENHYDRAMINE HCL 12.5 MG/5ML PO ELIX
12.5000 mg | ORAL_SOLUTION | Freq: Four times a day (QID) | ORAL | Status: DC | PRN
  Filled 2021-02-27: qty 5

## 2021-02-27 MED ORDER — SODIUM CHLORIDE 0.9% FLUSH: 9.0000 mL | INTRAVENOUS | Status: DC | PRN

## 2021-02-27 MED ORDER — ONDANSETRON HCL 4 MG/2ML IJ SOLN
4.0000 mg | Freq: Four times a day (QID) | INTRAMUSCULAR | Status: DC | PRN
  Filled 2021-02-27: qty 2

## 2021-02-27 MED ORDER — HYDROMORPHONE HCL 1 MG/ML IJ SOLN
1.0000 mg | Freq: Once | INTRAMUSCULAR | Status: AC
  Administered 2021-02-27: 1 mg via INTRAVENOUS
  Filled 2021-02-27: qty 1

## 2021-02-27 MED ORDER — HYDROMORPHONE HCL 1 MG/ML IJ SOLN
1.0000 mg | Freq: Once | INTRAMUSCULAR | Status: AC
Start: 2021-02-27 — End: 2021-02-27
  Administered 2021-02-27: 1 mg via INTRAVENOUS
  Filled 2021-02-27: qty 1

## 2021-02-27 MED ORDER — LEVETIRACETAM 1000 MG PO TABS: 1000.0000 mg | ORAL_TABLET | Freq: Two times a day (BID) | ORAL | 1 refills | Status: DC

## 2021-02-27 MED ORDER — TRANEXAMIC ACID-NACL 1000-0.7 MG/100ML-% IV SOLN
1000.0000 mg | INTRAVENOUS | Status: AC
  Administered 2021-02-28: 1000 mg via INTRAVENOUS
  Filled 2021-02-27: qty 100

## 2021-02-27 MED ORDER — OXYCODONE HCL 5 MG PO TABS
5.0000 mg | ORAL_TABLET | ORAL | Status: DC | PRN
  Administered 2021-03-01 – 2021-03-04 (×5): 5 mg via ORAL
  Filled 2021-02-27 (×4): qty 1

## 2021-02-27 MED ORDER — SODIUM CHLORIDE 0.9 % IV SOLN
1.0000 g | Freq: Once | INTRAVENOUS | Status: AC
  Administered 2021-02-27: 1 g via INTRAVENOUS
  Filled 2021-02-27: qty 10

## 2021-02-27 MED ORDER — HYDROMORPHONE 1 MG/ML IV SOLN
INTRAVENOUS | Status: AC
  Administered 2021-02-28: 1.8 mg via INTRAVENOUS
  Administered 2021-02-28: 0.6 mg via INTRAVENOUS
  Administered 2021-02-28: 2.3 mg via INTRAVENOUS
  Administered 2021-02-28: 0.3 mg via INTRAVENOUS
  Administered 2021-03-01: 1.8 mg via INTRAVENOUS
  Administered 2021-03-01: 0.6 mg via INTRAVENOUS
  Administered 2021-03-01: 1.2 mg via INTRAVENOUS
  Administered 2021-03-01: 2.1 mg via INTRAVENOUS
  Filled 2021-02-27: qty 30

## 2021-02-27 MED ORDER — NALOXONE HCL 0.4 MG/ML IJ SOLN: 0.4000 mg | INTRAMUSCULAR | Status: DC | PRN

## 2021-02-27 MED ORDER — DEXTROSE-NACL 5-0.45 % IV SOLN: INTRAVENOUS | Status: DC

## 2021-02-27 MED ORDER — ONDANSETRON HCL 4 MG/2ML IJ SOLN
4.0000 mg | Freq: Four times a day (QID) | INTRAMUSCULAR | Status: DC | PRN
  Administered 2021-02-27 – 2021-03-01 (×3): 4 mg via INTRAVENOUS
  Filled 2021-02-27: qty 2

## 2021-02-27 MED ORDER — LEVETIRACETAM 500 MG PO TABS: 1000.0000 mg | ORAL_TABLET | Freq: Two times a day (BID) | ORAL | Status: DC

## 2021-02-27 MED ORDER — LEVETIRACETAM IN NACL 1000 MG/100ML IV SOLN
1000.0000 mg | Freq: Once | INTRAVENOUS | Status: AC
  Administered 2021-02-27: 1000 mg via INTRAVENOUS
  Filled 2021-02-27: qty 100

## 2021-02-27 MED ORDER — LORAZEPAM 2 MG/ML IJ SOLN
1.0000 mg | INTRAMUSCULAR | Status: DC | PRN
  Administered 2021-02-28: 1 mg via INTRAVENOUS
  Filled 2021-02-27: qty 1

## 2021-02-27 MED ORDER — OXYCODONE HCL 5 MG PO TABS: 5.0000 mg | ORAL_TABLET | ORAL | Status: DC | PRN

## 2021-02-27 MED ORDER — LEVETIRACETAM 750 MG PO TABS: 1500.0000 mg | ORAL_TABLET | Freq: Two times a day (BID) | ORAL | Status: DC

## 2021-02-27 MED ORDER — MELATONIN 3 MG PO TABS
3.0000 mg | ORAL_TABLET | Freq: Every evening | ORAL | Status: DC | PRN
  Filled 2021-02-27: qty 1

## 2021-02-27 MED ORDER — ACETAMINOPHEN 325 MG PO TABS
650.0000 mg | ORAL_TABLET | ORAL | Status: DC | PRN
Start: 2021-02-27 — End: 2021-03-03
  Administered 2021-03-01: 650 mg via ORAL
  Filled 2021-02-27: qty 2

## 2021-02-27 MED ORDER — METOPROLOL TARTRATE 5 MG/5ML IV SOLN
5.0000 mg | Freq: Four times a day (QID) | INTRAVENOUS | Status: DC | PRN
  Administered 2021-02-28: 5 mg via INTRAVENOUS
  Filled 2021-02-27: qty 5

## 2021-02-27 MED ORDER — IOHEXOL 350 MG/ML SOLN
75.0000 mL | Freq: Once | INTRAVENOUS | Status: AC | PRN
  Administered 2021-02-27: 75 mL via INTRAVENOUS

## 2021-02-27 MED ORDER — CEFAZOLIN SODIUM-DEXTROSE 2-4 GM/100ML-% IV SOLN
2.0000 g | INTRAVENOUS | Status: AC
  Administered 2021-02-28 (×2): 2 g via INTRAVENOUS
  Filled 2021-02-27: qty 100

## 2021-02-27 MED ORDER — ONDANSETRON HCL 4 MG/2ML IJ SOLN
4.0000 mg | Freq: Once | INTRAMUSCULAR | Status: AC
  Administered 2021-02-27: 4 mg via INTRAVENOUS
  Filled 2021-02-27: qty 2

## 2021-02-27 MED ORDER — DOCUSATE SODIUM 100 MG PO CAPS
100.0000 mg | ORAL_CAPSULE | Freq: Two times a day (BID) | ORAL | Status: DC
  Administered 2021-02-28 – 2021-03-04 (×7): 100 mg via ORAL
  Filled 2021-02-27 (×6): qty 1

## 2021-02-27 MED ORDER — LEVETIRACETAM IN NACL 1500 MG/100ML IV SOLN
1500.0000 mg | Freq: Two times a day (BID) | INTRAVENOUS | Status: DC
  Administered 2021-02-27 – 2021-03-04 (×10): 1500 mg via INTRAVENOUS
  Filled 2021-02-27 (×10): qty 100

## 2021-02-27 MED ORDER — ONDANSETRON 4 MG PO TBDP: 4.0000 mg | ORAL_TABLET | Freq: Four times a day (QID) | ORAL | Status: DC | PRN

## 2021-02-27 MED ORDER — LORAZEPAM BOLUS VIA INFUSION: 1.0000 mg | INTRAVENOUS | Status: DC | PRN

## 2021-02-27 NOTE — ED Provider Notes (Addendum)
  Physical Exam  BP (!) 135/97 (BP Location: Right Arm)   Pulse (!) 107   Temp 98.4 F (36.9 C) (Temporal)   Resp (!) 30   SpO2 96%   Physical Exam  ED Course/Procedures     .Critical Care Performed by: Ernie Avena, MD Authorized by: Ernie Avena, MD   Critical care provider statement:    Critical care time (minutes):  40   Critical care was necessary to treat or prevent imminent or life-threatening deterioration of the following conditions:  Trauma   Critical care was time spent personally by me on the following activities:  Discussions with consultants, evaluation of patient's response to treatment, examination of patient, ordering and performing treatments and interventions, ordering and review of laboratory studies, ordering and review of radiographic studies, pulse oximetry, re-evaluation of patient's condition, obtaining history from patient or surrogate and review of old charts  MDM    40F, hx of sx on Keppra, seizure on Keppra with severe back pain. Getting trauma scans.   5:15 PM I reassessed the patient.  She remains in significant pain.  1 mg Dilaudid reordered.  Review of her chest x-ray reveals evidence of thoracic spinal fractures.  Will consult spine once CT imaging results.  Imaging results: CT head without acute intracranial abnormality, CT cervical spine without acute fracture or malalignment.  Chest x-ray without acute rib fracture, evidence of thoracic spinal fractures.   5:53 PM CT of the thoracic spine reveals a T7 burst fracture with distraction and displacement of fragments including mild retropulsion into the spinal canal, involvement of posterior elements bilaterally.  T6 fracture involving inferior endplate and left posterior elements.  Small areas of mineralization dorsal to the mid T6 vertebral body which could reflect bone fragments within the spinal canal.  Ligamentous injury is present and MRI is recommended.  Neurosurgery was consulted for  further recommendations.    Additional concern for posterior mediastinal hematoma on CT chest likely related to the patient's burst fracture with no evidence of aortic injury.  Trauma surgery consulted.    On my examination, the patient will respond but not fully withdraw to painful stimuli in the bilateral lower extremities.  She will wiggle toes to command.  I am uncertain if her bilateral 2 out of 5 strength in her lower extremities is due to pain or spinal cord injury.   7:14 PM Spoke with Dr. Julien Girt over the phone who reviewed the patient's CT imaging.  Plan for stat MRI.  He states that one of his colleagues will be present bedside shortly to evaluate the patient.  Patient was subsequently admitted to the trauma ICU service for further care and management.     Ernie Avena, MD 02/27/21 2108    Ernie Avena, MD 02/27/21 2123

## 2021-02-27 NOTE — ED Notes (Signed)
Pt placed in aspen collar °

## 2021-02-27 NOTE — ED Notes (Signed)
Patient transported to CT 

## 2021-02-27 NOTE — ED Notes (Signed)
Pt placed on 2L [place], O2 97%

## 2021-02-27 NOTE — Consult Note (Signed)
Neurology Consultation Reason for Consult: Seizure management Requesting Physician: Feliciana Rossetti  CC: Seizure resulting in fall and thoracic spine fracture  History is obtained from:  Nursing at bedside and chart review, family had just left bedside at the time of my evaluation  HPI: Laura Wyatt is a 35 y.o. female with a past medical history significant for epilepsy  She fell off a ladder 9/22 due to seizure, reportedly falling 10 to 12 feet, and was confused on EMS arrival.  She sustained a T6/T7 fracture, planned for surgical stabilization by neurosurgery.  At the time of my evaluation, she had received Ativan for MRI of her spine and was too sedated to participate much.  When she did wake she would generally just moan in pain and fall asleep quickly.  Per nursing, family reported she has been adherent with her Keppra.  Nurse also reports that the patient was in and out catheterized just prior to my evaluation for 1200 mL of urine concerning for retention, but the prior to MRI and receiving Ativan she was able to move all 4 extremities antigravity though weaker in the lower extremities in the uppers, and she was able to communicate her needs and follow commands  She was last seen by inpatient neurology in the setting of breakthrough seizure in the setting of medication nonadherence (01/28/2020), with last outpatient follow-up 11/07/18  Her seizure HPI is excellently summarized in her outpatient notes by Dr. Loma Newton:  "Epilepsy HPI I reviewed studies and chart notes. Pertinent content summarized below and verified with patient.  Patient reported the first episode of seizure in the year 2005, about 3 months postpartum. She reported a grand mal seizure, and endorsed tongue bite and urinary incontinence with associated loss of consciousness and was taken to local hospital. She was started on anti seizure medication Keppra at that point of time. Since then she has been taking Keppra, though not very  compliant secondary to insurance issues. She has been having grand mal seizures.   Seizure risk factors: She was the product of an uncomplicated pregnancy and delivery. The patient had a normal development.There is no history febrile seizure as an infant or child, meningitis, encephalitis, known brain structural abnormality,or significant head trauma. There is no family history of seizures or epilepsy.    Onset: The patient had her first seizure at age 27 years Number of seizure types: 1 Seizure/seizure-like event description:  - Aura: None - Semiology: Sudden onset generalized body shaking, whole body stiffening with associated loss of consciousness and tongue laceration, and urinary incontinence Frequency: The patient's seizure frequency is about 2 seizures per month with the last seizure having occurred on November 29, 2019. Reported 5 seizures, back-to-back on November 29, 2019 and she had to go to the hospital. 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   Admission for status epilepticus: None Admission for frequent seizures: Yes/ER visit in June 2021 Injuries during seizures or attributed to seizures: Yes bruises on shoulder and leg"   ROS: Unable to obtain due to altered mental status.   Past Medical History:  Diagnosis Date   Epilepsy (HCC)    No family history on file. -Negative for a family history of seizures as documented in [facility] charts  Social History:  reports that she has never smoked.  She has never used smokeless tobacco. She reports that she does not drink alcohol and does not use drugs.  Exam: Current vital signs: BP 117/80   Pulse (!) 129   Temp 98.4 F (36.9 C) (Temporal)   Resp 20   SpO2 100%  Vital signs in  last 24 hours: Temp:  [98.4 F (36.9 C)] 98.4 F (36.9 C) (09/22 1441) Pulse Rate:  [102-132] 129 (09/22 1830) Resp:  [18-31] 20 (09/22 1830) BP: (117-135)/(79-97) 117/80 (09/22 1830) SpO2:  [91 %-100 %] 100 % (09/22 1830)   Physical Exam  Constitutional: Appears well-developed and well-nourished, but acutely ill.  Psych: Too somnolent to assess  Eyes: No scleral injection HENT: Collar in place.  Crusting on the right greater than left eye, with significant ecchymosis and mild swelling of the right eye, concerning for potential right eye ocular injury MSK: no joint deformities.  Cardiovascular: Tachycardic to the 140s Respiratory: Effort normal, non-labored breathing GI: Soft.  No distension.  Skin: Scattered bruising  Neuro: Mental Status: Patient awakens briefly to repeated stimulation.  She is able to show me 2 fingers on command and briefly tracks me. Cranial Nerves: II: Visual Fields are difficult to assess given mental status. Pupils are equal, round, and reactive to light.   III,IV, VI: Frequently disconjugate gaze in the setting of her sedation, intermittent roving eye movements V: Facial sensation is symmetric to light eyelash brush VII: Facial movement is symmetric within ability to assess given mental status.  VIII: hearing is intact to voice XII: tongue is midline without atrophy or fasciculations.  Sensory/motor: Tone is normal. Bulk is normal.  She is at least antigravity with the bilateral upper extremities, perhaps moving the left slightly better than the right upper extremity.  In the lower extremities she is at least 2/5 bilaterally withdrawing from light noxious stimulation (tickle) Sensory: Equally reactive to gentle stimulation in all 4 extremities Deep Tendon Reflexes: 2+ in the bilateral brachial radialis, absent at the patellae.  Plantars: Toes are downgoing bilaterally.  Cerebellar: Unable to assess secondary to patient's mental status    I have  reviewed labs in epic and the results pertinent to this consultation are: Ethanol undetectable   I have reviewed the images obtained:   Head CT personally reviewed, agree with radiology: No acute intracranial abnormality. No skull fracture.  CT c-spine personally reviewed, agree with radiology:  1. No acute fracture or subluxation of the cervical spine. 2. Well corticated lucency through the spinous process of T1 may represent unfused apophysis or sequela of remote injury.  CT L and T-spine personally reviewed, agree with radiology: T7 burst fracture with distraction/displacement of fragments including mild retropulsion into spinal canal. Involvement of posterior elements bilaterally.  T6 fracture involving inferior endplate and left posterior elements.   Small areas of mineralization dorsal to the mid T6 vertebral body (series 9, ) could reflect fragments within the canal. Ligamentous injury is present and MRI is recommended.  CT chest/abdomen/pelvis 1. Posterior mediastinal hematoma in the chest related to thoracic spine fracture. No evidence of acute aortic injury. Soft tissue thickening adjacent to the descending aorta at the level of fracture is felt to be related to spinal injury, no discrete aortic irregularity or evidence of traumatic vascular injury.  2. Thoracic spine fracture delineated on concurrent spinal reformats, reported separately. 3. Small bilateral pleural effusions, left greater than right. Areas of atelectasis and patchy airspace disease in both lungs, felt to be due to hypoventilatory change.  4. No evidence  of acute traumatic injury to the abdomen or pelvis.  5. Moderately advanced hepatic steatosis. 6. Simple left ovarian cyst measuring 3.1 cm. No follow-up imaging recommended. Note: This recommendation does not apply to premenarchal patients and to those with increased risk (genetic, family history, elevated tumor markers or other high-risk factors) of  ovarian cancer. Reference: JACR 2020 Feb; 17(2):248-254  MRI Thoracic spine:  1. Acute burst type compression fracture involving the T7 vertebral body with displacement/distraction, and extension through the posterior elements bilaterally. Associated height loss measures up to 50% with up to 6 mm of bony retropulsion. Resultant mild spinal stenosis at this level.  2. Acute fracture involving the left aspect of the inferior endplate of T6 with extension into the posterior elements bilaterally at this level as well. 3. Additional acute fracture involving the right aspect of the superior endplate of T8. No extension through the posterior elements at this level. 4. Evidence for ligamentous disruption involving the PLL, ligamentum flavum, and likely the AML at the level of T7. This is consistent with an unstable injury. 5. Question cord signal abnormality at the level of C6-7, suspicious for contusion. Difficult to be certain of this finding given motion artifact on this exam. Correlation with physical exam for possible myelopathic symptoms recommended. Additionally, a short interval follow-up MRI to evaluate for the persistence of this finding could be performed as warranted. 6. Extensive soft tissue injury within the posterior paraspinous soft tissues, greater on the left. 7. Moderate bilateral pleural effusions with associated atelectasis, left greater than right.  Impression: Breakthrough seizure of unclear etiology resulting in significant injuries.  Given lack of precipitant, will increase Keppra to 1500 mg twice daily for better seizure control.  Recommendations: -Keppra level prior to receiving medications here to confirm adherence ordered and pending -UA, UDS, to rule out provoking etiologies of seizure obtained and pending -Monitor CK to peak -Primary team to adjust IV fluids as needed for potential rhabdomyolysis -Increase 1500 mg Keppra twice daily; if level on admission is  undetectable will plan to return to prior dose (1000 mg BID) -No indication for EEG unless patient has further seizure activity or does not recover from her suspected postictal confusion -Appreciate neurosurgical management of patient's thoracic spine fracture -CTA head and neck, to rule out dissection given severity of her injuries (nonemergent given recent contrast for abdominal imaging and overall low concern for stroke) -Continue to monitor her eyes, consider ophthalmological evaluation for possible right eye injury given significant discharge from the eye -If patient reports weakness or numbness in the upper extremities when her mental status improves, obtain MRI C-spine -Neurology will follow-up keppra level to confirm Keppra dosing recommendations as well as CTA and otherwise will be available as needed going forward, please repage if questions arise   Brooke Dare MD-PhD [facility] [phone]

## 2021-02-27 NOTE — ED Triage Notes (Signed)
Pt here by EMS d/t fall from ladder, approx 10 feet. Fell to grass. Per EMS seizure after fall. Unknown duration. Pt was disoriented and postictal during transport. Pt screaming and moving extremities on arrival. Pt states everything hurts.   138/92 P 114 RR 22 96% RA  CBG 156

## 2021-02-27 NOTE — ED Provider Notes (Addendum)
[facility] EMERGENCY DEPARTMENT Provider Note   CSN: [id number] Arrival date & time: 02/27/21  1427     History Chief Complaint  Patient presents with   Fall    Laura Wyatt is a 35 y.o. female.  35 yo F with history of seizures comes in with a chief complaints of back pain after reportedly having a seizure while she was about 10 to 12 feet on a ladder and landed on the ground.  Complaining mostly of mid to lower back pain.  She also has some pain to her abdomen.  Unsure of head injury.  Denies extremity pain.  The history is provided by the patient and the EMS personnel.  Fall This is a new problem. The current episode started 1 to 2 hours ago. The problem occurs rarely. The problem has been resolved. Pertinent negatives include no chest pain, no headaches and no shortness of breath. Nothing aggravates the symptoms. Nothing relieves the symptoms. She has tried nothing for the symptoms. The treatment provided no relief.      Past Medical History:  Diagnosis Date   Epilepsy [facility])     Patient Active Problem List   Diagnosis Date Noted   Recurrent seizures (HCC) 01/28/2020   Epilepsy (HCC) 01/28/2020   Non-compliance with treatment 01/28/2020    No past surgical history on file.   OB History   No obstetric history on file.     No family history on file.  Social History   Tobacco Use   Smoking status: Never   Smokeless tobacco: Never  Substance Use Topics   Alcohol use: Never   Drug use: Never    Home Medications Prior to Admission medications   Medication Sig Start Date End Date Taking? Authorizing Provider  levETIRAcetam (KEPPRA) 1000 MG tablet Take 1 tablet (1,000 mg total) by mouth 2 (two) times daily. 05/22/20 07/21/20  Sharman Cheek, MD  nitrofurantoin (MACRODANTIN) 100 MG capsule Take 1 capsule (100 mg total) by mouth 2 (two) times daily. 05/22/20   Sharman Cheek, MD    Allergies    Patient has no known  allergies.  Review of Systems   Review of Systems  Constitutional:  Negative for chills and fever.  HENT:  Negative for congestion and rhinorrhea.   Eyes:  Negative for redness and visual disturbance.  Respiratory:  Negative for shortness of breath and wheezing.   Cardiovascular:  Negative for chest pain and palpitations.  Gastrointestinal:  Negative for nausea and vomiting.  Genitourinary:  Negative for dysuria and urgency.  Musculoskeletal:  Positive for arthralgias, back pain and myalgias.  Skin:  Negative for pallor and wound.  Neurological:  Negative for dizziness and headaches.   Physical Exam Updated Vital Signs BP (!) 135/97 (BP Location: Right Arm)   Pulse (!) 107   Temp 98.4 F (36.9 C) (Temporal)   Resp (!) 30   SpO2 96%   Physical Exam Vitals and nursing note reviewed.  Constitutional:      General: She is not in acute distress.    Appearance: She is well-developed. She is not diaphoretic.  HENT:     Head: Normocephalic and atraumatic.  Eyes:     Pupils: Pupils are equal, round, and reactive to light.  Cardiovascular:     Rate and Rhythm: Normal rate and regular rhythm.     Heart sounds: No murmur heard.   No friction rub. No gallop.  Pulmonary:     Effort: Pulmonary effort is normal.  Breath sounds: No wheezing or rales.  Abdominal:     General: There is no distension.     Palpations: Abdomen is soft.     Tenderness: There is abdominal tenderness.     Comments: Diffuse abdominal tenderness with guarding.  No obvious bruising.  Musculoskeletal:        General: Tenderness present.     Cervical back: Normal range of motion and neck supple.     Comments: Pain worse to the upper and mid back.  No obvious step-offs.  Exam somewhat limited secondary to body habitus.  Skin:    General: Skin is warm and dry.  Neurological:     Mental Status: She is alert and oriented to person, place, and time.  Psychiatric:        Behavior: Behavior normal.    ED  Results / Procedures / Treatments   Labs (all labs ordered are listed, but only abnormal results are displayed) Labs Reviewed  CBC - Abnormal; Notable for the following components:      Result Value   WBC 23.0 (*)    Hemoglobin 9.6 (*)    HCT 30.9 (*)    MCV 79.8 (*)    MCH 24.8 (*)    All other components within normal limits  I-STAT CHEM 8, ED - Abnormal; Notable for the following components:   Glucose, Bld 185 (*)    TCO2 20 (*)    Hemoglobin 10.5 (*)    HCT 31.0 (*)    All other components within normal limits  RESP PANEL BY RT-PCR (FLU A&B, COVID) ARPGX2  COMPREHENSIVE METABOLIC PANEL  ETHANOL  URINALYSIS, ROUTINE W REFLEX MICROSCOPIC  LACTIC ACID, PLASMA  PROTIME-INR  SAMPLE TO BLOOD BANK    EKG None  Radiology No results found.  Procedures Procedures   Medications Ordered in ED Medications  HYDROmorphone (DILAUDID) injection 1 mg (1 mg Intravenous Given 02/27/21 1456)  ondansetron (ZOFRAN) injection 4 mg (4 mg Intravenous Given 02/27/21 1455)    ED Course  I have reviewed the triage vital signs and the nursing notes.  Pertinent labs & imaging results that were available during my care of the patient were reviewed by me and considered in my medical decision making (see chart for details).    MDM Rules/Calculators/A&P                           35 yo F with a chief complaints of back pain after falling off a ladder.  Patient has a history of seizures and by record review is on Keppra.  Per her family she is not been on that medicine for at least a couple months.  They told her not to go up on the ladder but she did anyway.  She is having quite significant pain on exam.  History is somewhat limited secondary to language barrier.  Will obtain trauma scans.  Reassess.  Signed out to Dr. Karene Fry, please see his note for further details of care in the ED.   The patients results and plan were reviewed and discussed.   Any x-rays performed were independently  reviewed by myself.   Differential diagnosis were considered with the presenting HPI.  Medications  levETIRAcetam (KEPPRA) IVPB 1000 mg/100 mL premix (1,000 mg Intravenous New Bag/Given 02/27/21 1538)  HYDROmorphone (DILAUDID) injection 1 mg (1 mg Intravenous Given 02/27/21 1456)  ondansetron (ZOFRAN) injection 4 mg (4 mg Intravenous Given 02/27/21 1455)    Vitals:  02/27/21 1441 02/27/21 1457  BP: 128/86 (!) 135/97  Pulse: (!) 108 (!) 107  Resp: (!) 24 (!) 30  Temp: 98.4 F (36.9 C)   TempSrc: Temporal   SpO2: 94% 96%    Final diagnoses:  Chest pain  Low back pain    Final Clinical Impression(s) / ED Diagnoses Final diagnoses:  Chest pain  Low back pain    Rx / DC Orders ED Discharge Orders     None        Melene Plan, DO 02/27/21 1549    Melene Plan, DO 02/27/21 1550

## 2021-02-27 NOTE — H&P (Signed)
Activation and Reason: consult, fall  Primary Survey: airway intact, breath sounds present bilaterally, pulses intact  Laura Wyatt is an 35 y.o. female.  HPI: (interview performed with Spanish phone interpreter) She fell down the stairs because she had a seizure. She has had seizures before but she has been taking her medicine and has not had a seizure in a long time. She has pain all over. The worse pain is in her back. Pain is constant. It is sharp. It does not radiate. The pain medication helps a little. Movement or touch makes the pain worse.  Past Medical History:  Diagnosis Date   Epilepsy (HCC)     No past surgical history on file.  No family history on file.  Social History:  reports that she has never smoked. She has never used smokeless tobacco. She reports that she does not drink alcohol and does not use drugs.  Allergies: No Known Allergies  Medications: I have reviewed the patient's current medications.  Results for orders placed or performed during the hospital encounter of 02/27/21 (from the past 48 hour(s))  Ethanol     Status: None   Collection Time: 02/27/21  2:35 PM  Result Value Ref Range   Alcohol, Ethyl (B) <10 <10 mg/dL    Comment: (NOTE) Lowest detectable limit for serum alcohol is 10 mg/dL.  For medical purposes only. Performed at [facility], [address], [place], [place] [address]   Lactic acid, plasma     Status: Abnormal   Collection Time: 02/27/21  2:35 PM  Result Value Ref Range   Lactic Acid, Venous 4.0 (HH) 0.5 - 1.9 mmol/L    Comment: CRITICAL RESULT CALLED TO, READ BACK BY AND VERIFIED WITH:  Vincent Peyer, RN, [phone], 02/27/21, Lenard Forth Performed at [facility], [address], [place], [place] [address]   Resp Panel by RT-PCR (Flu A&B, Covid) Nasopharyngeal Swab     Status: None   Collection Time: 02/27/21  2:47 PM   Specimen: Nasopharyngeal Swab; Nasopharyngeal(NP) swabs in vial transport medium  Result Value  Ref Range   SARS Coronavirus 2 by RT PCR NEGATIVE NEGATIVE    Comment: (NOTE) SARS-CoV-2 target nucleic acids are NOT DETECTED.  The SARS-CoV-2 RNA is generally detectable in upper respiratory specimens during the acute phase of infection. The lowest concentration of SARS-CoV-2 viral copies this assay can detect is 138 copies/mL. A negative result does not preclude SARS-Cov-2 infection and should not be used as the sole basis for treatment or other patient management decisions. A negative result may occur with  improper specimen collection/handling, submission of specimen other than nasopharyngeal swab, presence of viral mutation(s) within the areas targeted by this assay, and inadequate number of viral copies(<138 copies/mL). A negative result must be combined with clinical observations, patient history, and epidemiological information. The expected result is Negative.  Fact Sheet for Patients:  BloggerCourse.com  Fact Sheet for Healthcare Providers:  SeriousBroker.it  This test is no t yet approved or cleared by the [place] FDA and  has been authorized for detection and/or diagnosis of SARS-CoV-2 by FDA under an Emergency Use Authorization (EUA). This EUA will remain  in effect (meaning this test can be used) for the duration of the COVID-19 declaration under Section 564(b)(1) of the Act, 21 U.S.C.section 360bbb-3(b)(1), unless the authorization is terminated  or revoked sooner.       Influenza A by PCR NEGATIVE NEGATIVE   Influenza B by PCR NEGATIVE NEGATIVE  Comment: (NOTE) The Xpert Xpress SARS-CoV-2/FLU/RSV plus assay is intended as an aid in the diagnosis of influenza from Nasopharyngeal swab specimens and should not be used as a sole basis for treatment. Nasal washings and aspirates are unacceptable for Xpert Xpress SARS-CoV-2/FLU/RSV testing.  Fact Sheet for  Patients: BloggerCourse.com  Fact Sheet for Healthcare Providers: SeriousBroker.it  This test is not yet approved or cleared by the [place] FDA and has been authorized for detection and/or diagnosis of SARS-CoV-2 by FDA under an Emergency Use Authorization (EUA). This EUA will remain in effect (meaning this test can be used) for the duration of the COVID-19 declaration under Section 564(b)(1) of the Act, 21 U.S.C. section 360bbb-3(b)(1), unless the authorization is terminated or revoked.  Performed at [facility], [address] [place], [place] [address]   Comprehensive metabolic panel     Status: Abnormal   Collection Time: 02/27/21  2:47 PM  Result Value Ref Range   Sodium 136 135 - 145 mmol/L   Potassium 3.5 3.5 - 5.1 mmol/L   Chloride 105 98 - 111 mmol/L   CO2 19 (L) 22 - 32 mmol/L   Glucose, Bld 164 (H) 70 - 99 mg/dL    Comment: Glucose reference range applies only to samples taken after fasting for at least 8 hours.   BUN 10 6 - 20 mg/dL   Creatinine, Ser 1.19 0.44 - 1.00 mg/dL   Calcium 8.9 8.9 - 41.7 mg/dL   Total Protein 7.2 6.5 - 8.1 g/dL   Albumin 3.6 3.5 - 5.0 g/dL   AST 408 (H) 15 - 41 U/L   ALT 52 (H) 0 - 44 U/L   Alkaline Phosphatase 81 38 - 126 U/L   Total Bilirubin 0.4 0.3 - 1.2 mg/dL   GFR, Estimated >14 >48 mL/min    Comment: (NOTE) Calculated using the CKD-EPI Creatinine Equation (2021)    Anion gap 12 5 - 15    Comment: Performed at [facility], [address], [place], [place] [address]  CBC     Status: Abnormal   Collection Time: 02/27/21  2:47 PM  Result Value Ref Range   WBC 23.0 (H) 4.0 - 10.5 K/uL   RBC 3.87 3.87 - 5.11 MIL/uL   Hemoglobin 9.6 (L) 12.0 - 15.0 g/dL   HCT 14.9 (L) 70.2 - 63.7 %   MCV 79.8 (L) 80.0 - 100.0 fL   MCH 24.8 (L) 26.0 - 34.0 pg   MCHC 31.1 30.0 - 36.0 g/dL   RDW 85.8 85.0 - 27.7 %   Platelets 380 150 - 400 K/uL   nRBC 0.0 0.0 - 0.2 %     Comment: Performed at [facility], [address] [place], [place] [address]  Protime-INR     Status: None   Collection Time: 02/27/21  2:47 PM  Result Value Ref Range   Prothrombin Time 12.2 11.4 - 15.2 seconds   INR 0.9 0.8 - 1.2    Comment: (NOTE) INR goal varies based on device and disease states. Performed at [facility], [address], [place], [place] [address]   Sample to Blood Bank     Status: None   Collection Time: 02/27/21  3:01 PM  Result Value Ref Range   Blood Bank Specimen SAMPLE AVAILABLE FOR TESTING    Sample Expiration      02/28/2021,2359 Performed at [facility], [address], [place], [place] [address]   I-Stat Chem 8,  ED     Status: Abnormal   Collection Time: 02/27/21  3:03 PM  Result Value Ref Range   Sodium 139 135 - 145 mmol/L   Potassium 3.8 3.5 - 5.1 mmol/L   Chloride 106 98 - 111 mmol/L   BUN 10 6 - 20 mg/dL   Creatinine, Ser 3.79 0.44 - 1.00 mg/dL   Glucose, Bld 024 (H) 70 - 99 mg/dL    Comment: Glucose reference range applies only to samples taken after fasting for at least 8 hours.   Calcium, Ion 1.15 1.15 - 1.40 mmol/L   TCO2 20 (L) 22 - 32 mmol/L   Hemoglobin 10.5 (L) 12.0 - 15.0 g/dL   HCT 09.7 (L) 35.3 - 29.9 %    CT HEAD WO CONTRAST  Result Date: 02/27/2021 CLINICAL DATA:  Head trauma, abnormal mental status (Age 64-64y) Fall off ladder.  Seizure after fall. EXAM: CT HEAD WITHOUT CONTRAST TECHNIQUE: Contiguous axial images were obtained from the base of the skull through the vertex without intravenous contrast. COMPARISON:  Most recent head CT 03/10/2020, brain MRI 01/28/2020 FINDINGS: Brain: No intracranial hemorrhage, mass effect, or midline shift. No hydrocephalus. The basilar cisterns are patent. No evidence of territorial infarct or acute ischemia. Minimal cerebellar atrophy is stable. No extra-axial or intracranial fluid collection. Vascular: No hyperdense vessel or unexpected calcification. Skull: No fracture  or focal lesion. Sinuses/Orbits: No fracture of included facial bones. Mild mucosal thickening of the paranasal sinuses. Unremarkable orbits. No mastoid effusion. Other: None. IMPRESSION: No acute intracranial abnormality. No skull fracture. Electronically Signed   By: Narda Rutherford M.D.   On: 02/27/2021 17:20   CT CERVICAL SPINE WO CONTRAST  Result Date: 02/27/2021 CLINICAL DATA:  Neck trauma, intoxicated or obtunded (Age >= 16y) Fall off ladder. EXAM: CT CERVICAL SPINE WITHOUT CONTRAST TECHNIQUE: Multidetector CT imaging of the cervical spine was performed without intravenous contrast. Multiplanar CT image reconstructions were also generated. COMPARISON:  None. FINDINGS: Alignment: Normal. Skull base and vertebrae: No acute fracture. Well corticated lucency through the spinous process of T1 may represent unfused apophysis or sequela of remote injury. Vertebral body heights are maintained. The dens and skull base are intact. Soft tissues and spinal canal: No prevertebral fluid or swelling. No visible canal hematoma. Disc levels:  Preserved.  Trace endplate spurring at C5-C6. Upper chest: Assessed on concurrent chest CT, reported separately. Other: None. IMPRESSION: 1. No acute fracture or subluxation of the cervical spine. 2. Well corticated lucency through the spinous process of T1 may represent unfused apophysis or sequela of remote injury. Electronically Signed   By: Narda Rutherford M.D.   On: 02/27/2021 17:23   CT CHEST ABDOMEN PELVIS W CONTRAST  Result Date: 02/27/2021 CLINICAL DATA:  Chest-abdomen-pelvis trauma, blunt Fall off ladder. Patient reports back pain, severe abdominal pain. Pain with inspiration. Abdominal distension. EXAM: CT CHEST, ABDOMEN, AND PELVIS WITH CONTRAST TECHNIQUE: Multidetector CT imaging of the chest, abdomen and pelvis was performed following the standard protocol during bolus administration of intravenous contrast. Thoracic and lumbar spine reformats are provided and  reported separately. CONTRAST:  16mL OMNIPAQUE IOHEXOL 350 MG/ML SOLN COMPARISON:  None available. FINDINGS: CT CHEST FINDINGS Cardiovascular: No evidence of acute aortic injury. Soft tissue thickening adjacent to the descending aorta at the level of thoracic spine fracture, felt to be related to spinal injury, no discrete aortic irregularity or evidence of traumatic vascular injury. The heart is normal in size. There is no pericardial effusion. Mediastinum/Nodes: Posterior mediastinal hematoma is related to  severe thoracic spine fracture. No pneumomediastinum. Distal esophagus is minimally patulous, but no definite wall thickening. No visualized thyroid nodule. No adenopathy. Lungs/Pleura: No pneumothorax. There are small bilateral pleural effusions, left greater than right. These appear homogeneous and measures simple fluid density. Areas of atelectasis and patchy airspace disease in both lungs. Trachea and central bronchi are patent. Musculoskeletal: Thoracic spine fractures are assessed on concurrent CT, reported separately. No acute fracture of the ribs, sternum, included clavicles or shoulder girdles. CT ABDOMEN PELVIS FINDINGS Hepatobiliary: No hepatic injury or perihepatic hematoma. Moderately advanced hepatic steatosis. Gallbladder is unremarkable. Pancreas: No evidence of injury. No ductal dilatation or inflammation. Spleen: No splenic injury or perisplenic hematoma. Adrenals/Urinary Tract: No adrenal hemorrhage or renal injury identified. Homogeneous renal enhancement with symmetric excretion on delayed phase imaging. Bladder is unremarkable. Stomach/Bowel: The stomach is distended with ingested contents. There is no evidence of bowel injury or mesenteric hematoma. No bowel wall thickening. No free air. Appendix is not definitively visualized Vascular/Lymphatic: No abdominal aortic or IVC injury. There is no retroperitoneal fluid. No acute vascular findings. No enlarged lymph nodes in the abdomen or  pelvis. Reproductive: Unremarkable appearance of the uterus and right ovary. 3.1 cm left ovarian cyst. Other: No free air or free fluid. Small fat containing umbilical hernia. No confluent body wall edema. Musculoskeletal: Lumbar spine assessed on concurrent lumbar spine CT, reported separately. No acute pelvic fracture. IMPRESSION: 1. Posterior mediastinal hematoma in the chest related to thoracic spine fracture. No evidence of acute aortic injury. Soft tissue thickening adjacent to the descending aorta at the level of fracture is felt to be related to spinal injury, no discrete aortic irregularity or evidence of traumatic vascular injury. 2. Thoracic spine fracture delineated on concurrent spinal reformats, reported separately. 3. Small bilateral pleural effusions, left greater than right. Areas of atelectasis and patchy airspace disease in both lungs, felt to be due to hypoventilatory change. 4. No evidence of acute traumatic injury to the abdomen or pelvis. 5. Moderately advanced hepatic steatosis. 6. Simple left ovarian cyst measuring 3.1 cm. No follow-up imaging recommended. Note: This recommendation does not apply to premenarchal patients and to those with increased risk (genetic, family history, elevated tumor markers or other high-risk factors) of ovarian cancer. Reference: JACR 2020 Feb; 17(2):248-254 Electronically Signed   By: Narda Rutherford M.D.   On: 02/27/2021 17:34   CT T-SPINE NO CHARGE  Result Date: 02/27/2021 CLINICAL DATA:  Back trauma EXAM: CT Thoracic and Lumbar spine with contrast TECHNIQUE: Multiplanar CT images of the thoracic and lumbar spine were reconstructed from contemporary CT of the Chest, Abdomen, and Pelvis CONTRAST:  No additional COMPARISON:  None FINDINGS: CT THORACIC SPINE FINDINGS Alignment: Retropulsion at T7. Vertebrae: Severe compression fracture of T7 involving superior and inferior endplates and anterior and posterior margins. There is distraction/displacement of  fragments particularly anteriorly. Mild retropulsion into the spinal canal. There is also involvement of posterior elements including laminae and transverse processes. Additional fracture through the inferior endplate of T6 eccentric to the left with involvement of the posteroinferior corner. Fracture of the left T6 lamina transverse process. Small area of ill-defined mineralization is present dorsal to the mid T6 vertebral body (series 9, image 83). Paraspinal and other soft tissues: Paraspinal hematoma at the fracture levels. Extra-spinal findings are better evaluated on concurrent dedicated imaging. Disc levels: No significant degenerative stenosis. CT LUMBAR SPINE FINDINGS Segmentation: 5 lumbar type vertebrae. Alignment: Preserved. Vertebrae: Vertebral body heights are maintained. No acute fracture. Paraspinal and other soft  tissues: Extra-spinal findings are better evaluated on concurrent dedicated imaging. Disc levels: No significant degenerative stenosis. IMPRESSION: T7 burst fracture with distraction/displacement of fragments including mild retropulsion into spinal canal. Involvement of posterior elements bilaterally. T6 fracture involving inferior endplate and left posterior elements. Small areas of mineralization dorsal to the mid T6 vertebral body (series 9, ) could reflect fragments within the canal. Ligamentous injury is present and MRI is recommended. Electronically Signed   By: Guadlupe Spanish M.D.   On: 02/27/2021 17:31   CT L-SPINE NO CHARGE  Result Date: 02/27/2021 CLINICAL DATA:  Back trauma EXAM: CT Thoracic and Lumbar spine with contrast TECHNIQUE: Multiplanar CT images of the thoracic and lumbar spine were reconstructed from contemporary CT of the Chest, Abdomen, and Pelvis CONTRAST:  No additional COMPARISON:  None FINDINGS: CT THORACIC SPINE FINDINGS Alignment: Retropulsion at T7. Vertebrae: Severe compression fracture of T7 involving superior and inferior endplates and anterior and  posterior margins. There is distraction/displacement of fragments particularly anteriorly. Mild retropulsion into the spinal canal. There is also involvement of posterior elements including laminae and transverse processes. Additional fracture through the inferior endplate of T6 eccentric to the left with involvement of the posteroinferior corner. Fracture of the left T6 lamina transverse process. Small area of ill-defined mineralization is present dorsal to the mid T6 vertebral body (series 9, image 83). Paraspinal and other soft tissues: Paraspinal hematoma at the fracture levels. Extra-spinal findings are better evaluated on concurrent dedicated imaging. Disc levels: No significant degenerative stenosis. CT LUMBAR SPINE FINDINGS Segmentation: 5 lumbar type vertebrae. Alignment: Preserved. Vertebrae: Vertebral body heights are maintained. No acute fracture. Paraspinal and other soft tissues: Extra-spinal findings are better evaluated on concurrent dedicated imaging. Disc levels: No significant degenerative stenosis. IMPRESSION: T7 burst fracture with distraction/displacement of fragments including mild retropulsion into spinal canal. Involvement of posterior elements bilaterally. T6 fracture involving inferior endplate and left posterior elements. Small areas of mineralization dorsal to the mid T6 vertebral body (series 9, ) could reflect fragments within the canal. Ligamentous injury is present and MRI is recommended. Electronically Signed   By: Guadlupe Spanish M.D.   On: 02/27/2021 17:31   DG Chest Port 1 View  Result Date: 02/27/2021 CLINICAL DATA:  Status post fall with severe back pain. EXAM: PORTABLE CHEST 1 VIEW COMPARISON:  January 28, 2020 FINDINGS: The heart size and mediastinal contours are within normal limits. Both lungs are clear. Acute fracture deformities are seen at the level of T6 and T7 vertebral bodies. IMPRESSION: 1. No active cardiopulmonary disease. 2. Acute fracture deformities at the  level of T6 and T7. MRI correlation is recommended. Electronically Signed   By: Aram Candela M.D.   On: 02/27/2021 16:08    Review of Systems  Unable to perform ROS: Acuity of condition   PE Blood pressure 117/80, pulse (!) 129, temperature 98.4 F (36.9 C), temperature source Temporal, resp. rate 20, SpO2 100 %. Constitutional: NAD; conversant; no deformities Eyes: Moist conjunctiva; no lid lag; anicteric; PERRL Neck: Trachea midline; no thyromegaly Lungs: Normal respiratory effort; no tactile fremitus CV: tachycardic, no palpable thrills; no pitting edema GI: Abd soft, pain on palpation throughout; no palpable hepatosplenomegaly MSK: can move both feet, unable to assess gait; no clubbing/cyanosis Psychiatric: Appropriate affect; alert and oriented x3 Lymphatic: No palpable cervical or axillary lymphadenopathy   Assessment/Plan: 35 yo female with epilepsy who feel down the stairs  T 7 burst fracture with mild retropulsion - log roll only, pain control, f/u NSGY recs T6  VB fracture - log roll only, pain control, f/u NSGY recs Posterior Mediastinal hematoma - will observe, likely 2/2/ to spinal fractures Epilepsy - restart home medications, PRN ativan  Admit to trauma ICU NPO IVF   Procedures: none  De Blanch Jaesean Litzau 02/27/2021, 7:19 PM

## 2021-02-27 NOTE — ED Notes (Signed)
Paged neurosurgery  

## 2021-02-27 NOTE — Consult Note (Signed)
   Providing Compassionate, Quality Care - Together  Neurosurgery Consult  Referring physician: Dr. Adela Lank Reason for referral: T6/7 Fx  Chief Complaint: Fall off ladder due to seizure  History of Present Illness: This is a 35 year old Spanish-speaking female, with a history of epilepsy, that fell off of a ladder due to seizure.  She was postictal upon arrival by EMS.  She reportedly fell approximately 10 to 12 feet.  There is somewhat of a language barrier and she is a poor historian.  At this time she is moaning in pain.  Majority of the history is per the chart.  She complains of excruciating pain in her back and pain with movement in her legs.   Medications: I have reviewed the patient's current medications. Allergies: No Known Allergies  History reviewed. No pertinent family history. Social History:  has no history on file for tobacco use, alcohol use, and drug use.  ROS: All positives and negatives listed in HPI above  Physical Exam:  Vital signs in last 24 hours: Temp:  [98 F (36.7 C)-98.3 F (36.8 C)] 98 F (36.7 C) (07/25 1814) Pulse Rate:  [58-128] 65 (07/26 0746) Resp:  [11-18] 14 (07/26 0217) BP: (138-182)/(65-125) 153/88 (07/26 0700) SpO2:  [91 %-98 %] 96 % (07/26 0746) PE: Awake and alert, Spanish-speaking Cervical collar in place Face symmetric EOMI PERRLA Moaning in pain, moderate distress Bilateral upper extremities 4+/5 throughout Bilateral lower extremities 3/5 at a minimum, distally dorsiflexion/plantarflexion she has 4+/5, there is pain limited effort Sensory light touch intact, increased pain bilaterally to the abdomen to light touch   Impression/Assessment:  35 year old female with  T6 left pedicle and posterior inferior vertebral body fracture T7 burst fracture with 3 column injury and posterior element fractures, unstable Epilepsy  Plan:  -Stat MRI thoracic spine -N.p.o. -We will attempt to contact family/friends for discussion of possible  surgical intervention.  She will likely need some type of stabilization and decompression pending MRI findings. -Strict logroll only   Thank you for allowing me to participate in this patient's care.  Please do not hesitate to call with questions or concerns.   Monia Pouch, DO Neurosurgeon [facility] Cell: [phone]

## 2021-02-27 NOTE — ED Notes (Signed)
Blister type rash noted on back, thoracic area and left scapula.

## 2021-02-28 ENCOUNTER — Inpatient Hospital Stay (HOSPITAL_COMMUNITY): Payer: Self-pay | Admitting: Certified Registered"

## 2021-02-28 ENCOUNTER — Inpatient Hospital Stay (HOSPITAL_COMMUNITY): Payer: Self-pay

## 2021-02-28 ENCOUNTER — Encounter (HOSPITAL_COMMUNITY): Payer: Self-pay

## 2021-02-28 ENCOUNTER — Inpatient Hospital Stay (HOSPITAL_COMMUNITY): Admission: EM | Disposition: A | Discharge status: Home / Self Care | Payer: Self-pay | Source: Home / Self Care

## 2021-02-28 ENCOUNTER — Other Ambulatory Visit: Payer: Self-pay

## 2021-02-28 LAB — RAPID URINE DRUG SCREEN, HOSP PERFORMED
Amphetamines: NOT DETECTED
Barbiturates: NOT DETECTED
Benzodiazepines: NOT DETECTED
Cocaine: NOT DETECTED
Opiates: POSITIVE — AB
Tetrahydrocannabinol: NOT DETECTED

## 2021-02-28 LAB — POCT I-STAT EG7
Acid-base deficit: 5 mmol/L — ABNORMAL HIGH (ref 0.0–2.0)
Bicarbonate: 20.6 mmol/L (ref 20.0–28.0)
Calcium, Ion: 1.11 mmol/L — ABNORMAL LOW (ref 1.15–1.40)
HCT: 28 % — ABNORMAL LOW (ref 36.0–46.0)
Hemoglobin: 9.5 g/dL — ABNORMAL LOW (ref 12.0–15.0)
O2 Saturation: 81 %
Patient temperature: 37
Potassium: 4.4 mmol/L (ref 3.5–5.1)
Sodium: 136 mmol/L (ref 135–145)
TCO2: 22 mmol/L (ref 22–32)
pCO2, Ven: 41.3 mmHg — ABNORMAL LOW (ref 44.0–60.0)
pH, Ven: 7.307 (ref 7.250–7.430)
pO2, Ven: 49 mmHg — ABNORMAL HIGH (ref 32.0–45.0)

## 2021-02-28 LAB — CREATININE, SERUM
Creatinine, Ser: 0.58 mg/dL (ref 0.44–1.00)
GFR, Estimated: >60 mL/min (ref >60)

## 2021-02-28 LAB — CK
Total CK: 653 U/L — ABNORMAL HIGH (ref 38–234)
Total CK: 667 U/L — ABNORMAL HIGH (ref 38–234)
Total CK: 1224 U/L — ABNORMAL HIGH (ref 38–234)

## 2021-02-28 LAB — URINALYSIS, ROUTINE W REFLEX MICROSCOPIC
Bilirubin Urine: NEGATIVE
Glucose, UA: NEGATIVE mg/dL
Hgb urine dipstick: NEGATIVE
Ketones, ur: NEGATIVE mg/dL
Leukocytes,Ua: NEGATIVE
Nitrite: NEGATIVE
Protein, ur: NEGATIVE mg/dL
Specific Gravity, Urine: 1.043 — ABNORMAL HIGH (ref 1.005–1.030)
pH: 5 (ref 5.0–8.0)

## 2021-02-28 LAB — BASIC METABOLIC PANEL
Anion gap: 8 (ref 5–15)
BUN: 11 mg/dL (ref 6–20)
CO2: 23 mmol/L (ref 22–32)
Calcium: 8.5 mg/dL — ABNORMAL LOW (ref 8.9–10.3)
Chloride: 106 mmol/L (ref 98–111)
Creatinine, Ser: 0.72 mg/dL (ref 0.44–1.00)
GFR, Estimated: >60 mL/min (ref >60)
Glucose, Bld: 159 mg/dL — ABNORMAL HIGH (ref 70–99)
Potassium: 3.9 mmol/L (ref 3.5–5.1)
Sodium: 137 mmol/L (ref 135–145)

## 2021-02-28 LAB — CBC
HCT: 26.7 % — ABNORMAL LOW (ref 36.0–46.0)
HCT: 27.4 % — ABNORMAL LOW (ref 36.0–46.0)
Hemoglobin: 8.3 g/dL — ABNORMAL LOW (ref 12.0–15.0)
Hemoglobin: 8.9 g/dL — ABNORMAL LOW (ref 12.0–15.0)
MCH: 24.6 pg — ABNORMAL LOW (ref 26.0–34.0)
MCH: 26.5 pg (ref 26.0–34.0)
MCHC: 31.1 g/dL (ref 30.0–36.0)
MCHC: 32.5 g/dL (ref 30.0–36.0)
MCV: 79.2 fL — ABNORMAL LOW (ref 80.0–100.0)
MCV: 81.5 fL (ref 80.0–100.0)
Platelets: 287 10*3/uL (ref 150–400)
Platelets: 224 10*3/uL (ref 150–400)
RBC: 3.37 MIL/uL — ABNORMAL LOW (ref 3.87–5.11)
RBC: 3.36 MIL/uL — ABNORMAL LOW (ref 3.87–5.11)
RDW: 14.6 % (ref 11.5–15.5)
RDW: 15 % (ref 11.5–15.5)
WBC: 12.2 10*3/uL — ABNORMAL HIGH (ref 4.0–10.5)
WBC: 11.6 10*3/uL — ABNORMAL HIGH (ref 4.0–10.5)
nRBC: 0 % (ref 0.0–0.2)
nRBC: 0 % (ref 0.0–0.2)

## 2021-02-28 LAB — POCT PREGNANCY, URINE: Preg Test, Ur: NEGATIVE

## 2021-02-28 LAB — PREPARE RBC (CROSSMATCH)

## 2021-02-28 SURGERY — POSTERIOR THORACIC FUSION 4 LEVELS
Anesthesia: General

## 2021-02-28 MED ORDER — BUPIVACAINE HCL (PF) 0.5 % IJ SOLN
INTRAMUSCULAR | Status: DC | PRN
  Administered 2021-02-28: 15 mL

## 2021-02-28 MED ORDER — LACTATED RINGERS IV SOLN: INTRAVENOUS | Status: DC

## 2021-02-28 MED ORDER — SODIUM CHLORIDE 0.9% FLUSH: 3.0000 mL | INTRAVENOUS | Status: DC | PRN

## 2021-02-28 MED ORDER — ORAL CARE MOUTH RINSE: 15.0000 mL | Freq: Once | OROMUCOSAL | Status: DC

## 2021-02-28 MED ORDER — ONDANSETRON HCL 4 MG/2ML IJ SOLN
INTRAMUSCULAR | Status: AC
  Filled 2021-02-28: qty 2

## 2021-02-28 MED ORDER — SUGAMMADEX SODIUM 200 MG/2ML IV SOLN
INTRAVENOUS | Status: DC | PRN
  Administered 2021-02-28: 200 mg via INTRAVENOUS

## 2021-02-28 MED ORDER — DEXAMETHASONE SODIUM PHOSPHATE 10 MG/ML IJ SOLN
INTRAMUSCULAR | Status: DC | PRN
  Administered 2021-02-28: 10 mg via INTRAVENOUS

## 2021-02-28 MED ORDER — OXYCODONE HCL 5 MG/5ML PO SOLN
5.0000 mg | Freq: Once | ORAL | Status: DC | PRN
Start: 2021-02-28 — End: 2021-02-28

## 2021-02-28 MED ORDER — SODIUM CHLORIDE 0.9 % IV SOLN: INTRAVENOUS | Status: DC | PRN

## 2021-02-28 MED ORDER — LIDOCAINE HCL (PF) 2 % IJ SOLN
INTRAMUSCULAR | Status: AC
  Filled 2021-02-28: qty 5

## 2021-02-28 MED ORDER — SODIUM CHLORIDE 0.9% FLUSH
3.0000 mL | Freq: Two times a day (BID) | INTRAVENOUS | Status: DC
  Administered 2021-02-28 – 2021-03-04 (×8): 3 mL via INTRAVENOUS

## 2021-02-28 MED ORDER — BACITRACIN ZINC 500 UNIT/GM EX OINT
TOPICAL_OINTMENT | CUTANEOUS | Status: AC
  Filled 2021-02-28: qty 28.35

## 2021-02-28 MED ORDER — KETAMINE HCL 50 MG/5ML IJ SOSY
PREFILLED_SYRINGE | INTRAMUSCULAR | Status: AC
  Filled 2021-02-28: qty 5

## 2021-02-28 MED ORDER — ONDANSETRON HCL 4 MG/2ML IJ SOLN: 4.0000 mg | Freq: Once | INTRAMUSCULAR | Status: DC | PRN

## 2021-02-28 MED ORDER — OXYCODONE HCL 5 MG PO TABS
10.0000 mg | ORAL_TABLET | ORAL | Status: DC | PRN
  Administered 2021-02-28 – 2021-03-04 (×5): 10 mg via ORAL
  Filled 2021-02-28 (×6): qty 2

## 2021-02-28 MED ORDER — SODIUM CHLORIDE 0.9% IV SOLUTION
Freq: Once | INTRAVENOUS | Status: DC
Start: 2021-02-28 — End: 2021-03-04

## 2021-02-28 MED ORDER — LACTATED RINGERS IV SOLN: INTRAVENOUS | Status: DC | PRN

## 2021-02-28 MED ORDER — FENTANYL CITRATE (PF) 100 MCG/2ML IJ SOLN
INTRAMUSCULAR | Status: AC
  Filled 2021-02-28: qty 2

## 2021-02-28 MED ORDER — SUCCINYLCHOLINE CHLORIDE 200 MG/10ML IV SOSY
PREFILLED_SYRINGE | INTRAVENOUS | Status: DC | PRN
  Administered 2021-02-28: 100 mg via INTRAVENOUS

## 2021-02-28 MED ORDER — 0.9 % SODIUM CHLORIDE (POUR BTL) OPTIME
TOPICAL | Status: DC | PRN
  Administered 2021-02-28: 1000 mL

## 2021-02-28 MED ORDER — LIDOCAINE-EPINEPHRINE 1 %-1:100000 IJ SOLN
INTRAMUSCULAR | Status: AC
  Filled 2021-02-28: qty 1

## 2021-02-28 MED ORDER — PHENOL 1.4 % MT LIQD: 1.0000 | OROMUCOSAL | Status: DC | PRN

## 2021-02-28 MED ORDER — OXYCODONE HCL 5 MG PO TABS: 5.0000 mg | ORAL_TABLET | Freq: Once | ORAL | Status: DC | PRN

## 2021-02-28 MED ORDER — PROPOFOL 10 MG/ML IV BOLUS
INTRAVENOUS | Status: DC | PRN
  Administered 2021-02-28: 200 mg via INTRAVENOUS

## 2021-02-28 MED ORDER — LIDOCAINE 2% (20 MG/ML) 5 ML SYRINGE
INTRAMUSCULAR | Status: DC | PRN
  Administered 2021-02-28: 80 mg via INTRAVENOUS

## 2021-02-28 MED ORDER — CEFAZOLIN SODIUM-DEXTROSE 2-4 GM/100ML-% IV SOLN
2.0000 g | Freq: Three times a day (TID) | INTRAVENOUS | Status: AC
  Administered 2021-03-01 (×2): 2 g via INTRAVENOUS
  Filled 2021-02-28 (×2): qty 100

## 2021-02-28 MED ORDER — ALBUMIN HUMAN 5 % IV SOLN: INTRAVENOUS | Status: DC | PRN

## 2021-02-28 MED ORDER — FENTANYL CITRATE (PF) 250 MCG/5ML IJ SOLN
INTRAMUSCULAR | Status: AC
  Filled 2021-02-28: qty 5

## 2021-02-28 MED ORDER — MENTHOL 3 MG MT LOZG
1.0000 | LOZENGE | OROMUCOSAL | Status: DC | PRN
  Filled 2021-02-28: qty 9

## 2021-02-28 MED ORDER — KETAMINE HCL 10 MG/ML IJ SOLN
INTRAMUSCULAR | Status: DC | PRN
  Administered 2021-02-28 (×2): 10 mg via INTRAVENOUS
  Administered 2021-02-28: 20 mg via INTRAVENOUS
  Administered 2021-02-28: 10 mg via INTRAVENOUS

## 2021-02-28 MED ORDER — METHOCARBAMOL 1000 MG/10ML IJ SOLN
1000.0000 mg | Freq: Four times a day (QID) | INTRAVENOUS | Status: DC | PRN
  Filled 2021-02-28: qty 10

## 2021-02-28 MED ORDER — LIDOCAINE-EPINEPHRINE 1 %-1:100000 IJ SOLN
INTRAMUSCULAR | Status: DC | PRN
  Administered 2021-02-28: 15 mL

## 2021-02-28 MED ORDER — ESMOLOL HCL 100 MG/10ML IV SOLN
INTRAVENOUS | Status: DC | PRN
  Administered 2021-02-28: 30 mg via INTRAVENOUS

## 2021-02-28 MED ORDER — SODIUM CHLORIDE 0.9 % IV SOLN
250.0000 mL | INTRAVENOUS | Status: DC
  Administered 2021-02-28: 250 mL via INTRAVENOUS

## 2021-02-28 MED ORDER — BUPIVACAINE HCL (PF) 0.5 % IJ SOLN
INTRAMUSCULAR | Status: AC
  Filled 2021-02-28: qty 30

## 2021-02-28 MED ORDER — ACETAMINOPHEN 10 MG/ML IV SOLN
INTRAVENOUS | Status: AC
  Filled 2021-02-28: qty 100

## 2021-02-28 MED ORDER — FENTANYL CITRATE (PF) 250 MCG/5ML IJ SOLN
INTRAMUSCULAR | Status: DC | PRN
  Administered 2021-02-28: 100 ug via INTRAVENOUS
  Administered 2021-02-28: 25 ug via INTRAVENOUS
  Administered 2021-02-28 (×3): 50 ug via INTRAVENOUS
  Administered 2021-02-28: 25 ug via INTRAVENOUS

## 2021-02-28 MED ORDER — FENTANYL CITRATE (PF) 100 MCG/2ML IJ SOLN
25.0000 ug | INTRAMUSCULAR | Status: DC | PRN
  Administered 2021-02-28: 50 ug via INTRAVENOUS

## 2021-02-28 MED ORDER — CHLORHEXIDINE GLUCONATE CLOTH 2 % EX PADS
6.0000 | MEDICATED_PAD | Freq: Every day | CUTANEOUS | Status: DC
  Administered 2021-02-28 – 2021-03-04 (×3): 6 via TOPICAL

## 2021-02-28 MED ORDER — FENTANYL CITRATE (PF) 100 MCG/2ML IJ SOLN: 50.0000 ug | Freq: Once | INTRAMUSCULAR | Status: AC

## 2021-02-28 MED ORDER — CHLORHEXIDINE GLUCONATE 0.12 % MT SOLN: 15.0000 mL | Freq: Once | OROMUCOSAL | Status: DC

## 2021-02-28 MED ORDER — THROMBIN 5000 UNITS EX SOLR
CUTANEOUS | Status: AC
  Filled 2021-02-28: qty 5000

## 2021-02-28 MED ORDER — PHENYLEPHRINE HCL (PRESSORS) 10 MG/ML IV SOLN
INTRAVENOUS | Status: DC | PRN
  Administered 2021-02-28 (×2): 40 ug via INTRAVENOUS

## 2021-02-28 MED ORDER — ACETAMINOPHEN 10 MG/ML IV SOLN
INTRAVENOUS | Status: DC | PRN
  Administered 2021-02-28: 1000 mg via INTRAVENOUS

## 2021-02-28 MED ORDER — METOPROLOL TARTRATE 5 MG/5ML IV SOLN
INTRAVENOUS | Status: DC | PRN
  Administered 2021-02-28: 1 mg via INTRAVENOUS

## 2021-02-28 MED ORDER — CHLORHEXIDINE GLUCONATE 0.12 % MT SOLN
OROMUCOSAL | Status: AC
  Administered 2021-02-28: 15 mL
  Filled 2021-02-28: qty 15

## 2021-02-28 MED ORDER — HEPARIN SODIUM (PORCINE) 5000 UNIT/ML IJ SOLN
5000.0000 [IU] | Freq: Three times a day (TID) | INTRAMUSCULAR | Status: DC
  Administered 2021-03-02 – 2021-03-04 (×8): 5000 [IU] via SUBCUTANEOUS
  Filled 2021-02-28 (×8): qty 1

## 2021-02-28 MED ORDER — BUPIVACAINE LIPOSOME 1.3 % IJ SUSP
INTRAMUSCULAR | Status: AC
  Filled 2021-02-28: qty 20

## 2021-02-28 MED ORDER — MIDAZOLAM HCL 5 MG/5ML IJ SOLN
INTRAMUSCULAR | Status: DC | PRN
  Administered 2021-02-28: 2 mg via INTRAVENOUS

## 2021-02-28 MED ORDER — FENTANYL CITRATE (PF) 100 MCG/2ML IJ SOLN
INTRAMUSCULAR | Status: AC
  Administered 2021-02-28: 50 ug via INTRAVENOUS
  Filled 2021-02-28: qty 2

## 2021-02-28 MED ORDER — METHOCARBAMOL 500 MG PO TABS
1000.0000 mg | ORAL_TABLET | Freq: Four times a day (QID) | ORAL | Status: DC | PRN
  Administered 2021-02-28: 1000 mg via ORAL
  Filled 2021-02-28: qty 2

## 2021-02-28 MED ORDER — ROCURONIUM BROMIDE 10 MG/ML (PF) SYRINGE
PREFILLED_SYRINGE | INTRAVENOUS | Status: AC
  Filled 2021-02-28: qty 10

## 2021-02-28 MED ORDER — MIDAZOLAM HCL 2 MG/2ML IJ SOLN
INTRAMUSCULAR | Status: AC
  Filled 2021-02-28: qty 2

## 2021-02-28 MED ORDER — BUPIVACAINE LIPOSOME 1.3 % IJ SUSP
INTRAMUSCULAR | Status: DC | PRN
  Administered 2021-02-28: 20 mL

## 2021-02-28 MED ORDER — PROPOFOL 10 MG/ML IV BOLUS
INTRAVENOUS | Status: AC
  Filled 2021-02-28: qty 20

## 2021-02-28 MED ORDER — ROCURONIUM BROMIDE 10 MG/ML (PF) SYRINGE
PREFILLED_SYRINGE | INTRAVENOUS | Status: DC | PRN
Start: 2021-02-28 — End: 2021-02-28
  Administered 2021-02-28: 20 mg via INTRAVENOUS
  Administered 2021-02-28: 50 mg via INTRAVENOUS
  Administered 2021-02-28 (×3): 20 mg via INTRAVENOUS
  Administered 2021-02-28: 50 mg via INTRAVENOUS

## 2021-02-28 MED ORDER — THROMBIN 5000 UNITS EX SOLR
OROMUCOSAL | Status: DC | PRN
  Administered 2021-02-28 (×3): 5 mL via TOPICAL

## 2021-02-28 SURGICAL SUPPLY — 75 items
BAG BANDED W/RUBBER/TAPE 36X54 (MISCELLANEOUS) ×4 IMPLANT
BAG COUNTER SPONGE SURGICOUNT (BAG) ×6 IMPLANT
BAND RUBBER #18 3X1/16 STRL (MISCELLANEOUS) ×4 IMPLANT
BASKET BONE COLLECTION (BASKET) ×2 IMPLANT
BUR CARBIDE MATCH 3.0 (BURR) ×2 IMPLANT
CAGE CERV CAPRI 16X13X18-21 (Cage) ×2 IMPLANT
CARTRIDGE OIL MAESTRO DRILL (MISCELLANEOUS) IMPLANT
CNTNR URN SCR LID CUP LEK RST (MISCELLANEOUS) ×1 IMPLANT
CONT SPEC 4OZ STRL OR WHT (MISCELLANEOUS) ×1
COVER BACK TABLE 60X90IN (DRAPES) ×2 IMPLANT
COVER MAYO STAND STRL (DRAPES) ×2 IMPLANT
DECANTER SPIKE VIAL GLASS SM (MISCELLANEOUS) IMPLANT
DIFFUSER DRILL AIR PNEUMATIC (MISCELLANEOUS) IMPLANT
DRAIN JACKSON RD 7FR 3/32 (WOUND CARE) IMPLANT
DRAPE C-ARM 42X72 X-RAY (DRAPES) ×2 IMPLANT
DRAPE C-ARMOR (DRAPES) ×2 IMPLANT
DRAPE LAPAROTOMY 100X72X124 (DRAPES) ×2 IMPLANT
DRAPE MICROSCOPE LEICA (MISCELLANEOUS) ×2 IMPLANT
DRAPE SURG 17X23 STRL (DRAPES) ×2 IMPLANT
DRSG AQUACEL AG ADV 3.5X10 (GAUZE/BANDAGES/DRESSINGS) ×2 IMPLANT
DRSG PAD ABDOMINAL 8X10 ST (GAUZE/BANDAGES/DRESSINGS) IMPLANT
DURAPREP 26ML APPLICATOR (WOUND CARE) ×2 IMPLANT
ELECT BLADE INSULATED 4IN (ELECTROSURGICAL) ×2
ELECT REM PT RETURN 9FT ADLT (ELECTROSURGICAL) ×2
ELECTRODE BLADE INSULATED 4IN (ELECTROSURGICAL) ×1 IMPLANT
ELECTRODE REM PT RTRN 9FT ADLT (ELECTROSURGICAL) ×1 IMPLANT
GAUZE 4X4 16PLY ~~LOC~~+RFID DBL (SPONGE) ×4 IMPLANT
GAUZE SPONGE 4X4 12PLY STRL (GAUZE/BANDAGES/DRESSINGS) ×2 IMPLANT
GLOVE SRG 8 PF TXTR STRL LF DI (GLOVE) ×2 IMPLANT
GLOVE SURG LTX SZ8 (GLOVE) ×4 IMPLANT
GLOVE SURG UNDER POLY LF SZ8 (GLOVE) ×2
GOWN STRL REUS W/ TWL LRG LVL3 (GOWN DISPOSABLE) IMPLANT
GOWN STRL REUS W/ TWL XL LVL3 (GOWN DISPOSABLE) ×1 IMPLANT
GOWN STRL REUS W/TWL 2XL LVL3 (GOWN DISPOSABLE) IMPLANT
GOWN STRL REUS W/TWL LRG LVL3 (GOWN DISPOSABLE)
GOWN STRL REUS W/TWL XL LVL3 (GOWN DISPOSABLE) ×1
GUIDEWIRE EVEREST 1.4X620 2-PK (WIRE) ×8 IMPLANT
GUIDEWIRE NITINOL BEVEL TIP (WIRE) ×4 IMPLANT
HEMOSTAT POWDER KIT SURGIFOAM (HEMOSTASIS) ×6 IMPLANT
KIT BASIN OR (CUSTOM PROCEDURE TRAY) ×2 IMPLANT
KIT POSITION SURG JACKSON T1 (MISCELLANEOUS) ×2 IMPLANT
KIT TURNOVER KIT B (KITS) ×2 IMPLANT
MARKER SKIN DUAL TIP RULER LAB (MISCELLANEOUS) ×2 IMPLANT
MODULE EMG NEEDLE SSEP NVM5 (NEEDLE) ×2 IMPLANT
NEEDLE BIOPSY DD SERENGETI 8G (NEEDLE) ×4 IMPLANT
NEEDLE HYPO 25X1 1.5 SAFETY (NEEDLE) ×2 IMPLANT
NEEDLE SPNL 18GX3.5 QUINCKE PK (NEEDLE) ×4 IMPLANT
NS IRRIG 1000ML POUR BTL (IV SOLUTION) ×2 IMPLANT
OIL CARTRIDGE MAESTRO DRILL (MISCELLANEOUS)
PACK LAMINECTOMY NEURO (CUSTOM PROCEDURE TRAY) ×2 IMPLANT
PAD ARMBOARD 7.5X6 YLW CONV (MISCELLANEOUS) ×6 IMPLANT
PATTIES SURGICAL .5 X.5 (GAUZE/BANDAGES/DRESSINGS) IMPLANT
PATTIES SURGICAL .5 X1 (DISPOSABLE) IMPLANT
PATTIES SURGICAL 1X1 (DISPOSABLE) IMPLANT
PUTTY BONE 100 VESUVIUS 2.5CC (Putty) ×4 IMPLANT
ROD 160MM (Rod) ×4 IMPLANT
SCREW CANN FENS EV 5.5X40 (Screw) ×8 IMPLANT
SCREW CANN FENS EV 6.5X45 (Screw) ×8 IMPLANT
SCREW PA FENS EVEREST 6.5X45 (Screw) ×2 IMPLANT
SET SCREW (Screw) ×9 IMPLANT
SET SCREW VRST (Screw) ×9 IMPLANT
SPONGE T-LAP 12X12 ~~LOC~~+RFID (SPONGE) ×2 IMPLANT
SPONGE T-LAP 4X18 ~~LOC~~+RFID (SPONGE) ×2 IMPLANT
STAPLER VISISTAT 35W (STAPLE) ×2 IMPLANT
SUT SILK 3 0 (SUTURE) ×1
SUT SILK 3-0 18XBRD TIE 12 (SUTURE) ×1 IMPLANT
SUT VIC AB 0 CT1 27 (SUTURE) ×2
SUT VIC AB 0 CT1 27XBRD ANBCTR (SUTURE) ×2 IMPLANT
SUT VIC AB 2-0 CP2 18 (SUTURE) ×4 IMPLANT
SUT VIC AB 3-0 SH 8-18 (SUTURE) ×4 IMPLANT
TAPE CLOTH SURG 4X10 WHT LF (GAUZE/BANDAGES/DRESSINGS) ×2 IMPLANT
TOWEL GREEN STERILE (TOWEL DISPOSABLE) IMPLANT
TOWEL GREEN STERILE FF (TOWEL DISPOSABLE) IMPLANT
TRAY FOLEY MTR SLVR 16FR STAT (SET/KITS/TRAYS/PACK) IMPLANT
WATER STERILE IRR 1000ML POUR (IV SOLUTION) ×2 IMPLANT

## 2021-02-28 NOTE — Progress Notes (Signed)
   Providing Compassionate, Quality Care - Together  NEUROSURGERY PROGRESS NOTE   S: pt s/e in pacu, VSS  O: EXAM:  BP 128/81   Pulse (!) 130   Temp 97.9 F (36.6 C) (Oral)   Resp 16   Ht 5\' 5"  (1.651 m)   Wt 87.4 kg   SpO2 99%   BMI 32.06 kg/m   Eyes closed, moaning incomprehensibly PERRL Face symmetric MAE spont BLE WD briskly to pain symmetrically  ASSESSMENT:  35 y.o. female with   T6 left pedicle and posterior inferior vertebral body fracture T7 burst fracture with 3 column injury and posterior element fractures, unstable Epilepsy  -s/p T4-9 fusion, T7 corpectomy on 02/28/2021   Plan:  -SQ[facility] ok starting 03/02/2021 -pain control TLSO brace when OOB Pt/ot eval Mon [facility]mv    Thank you for allowing me to participate in this patient's care.  Please do not hesitate to call with questions or concerns.   03/04/2021, DO Neurosurgeon [facility] Cell: [phone]

## 2021-02-28 NOTE — Progress Notes (Signed)
Trauma/Critical Care Follow Up Note  Subjective:    Overnight Issues:   Objective:  Vital signs for last 24 hours: Temp:  [98.2 F (36.8 C)-100.1 F (37.8 C)] 99.4 F (37.4 C) (09/23 0400) Pulse Rate:  [102-151] 130 (09/23 0700) Resp:  [11-31] 17 (09/23 0821) BP: (112-137)/(76-97) 130/86 (09/23 0700) SpO2:  [91 %-100 %] 93 % (09/23 0821) FiO2 (%):  [28 %] 28 % (09/23 0334)  Hemodynamic parameters for last 24 hours:    Intake/Output from previous day: 09/22 0701 - 09/23 0700 In: 1231 [I.V.:1030.8; IV Piggyback:200.2] Out: 1000 [Urine:1000]  Intake/Output this shift: No intake/output data recorded.  Vent settings for last 24 hours: FiO2 (%):  [28 %] 28 %  Physical Exam:  Gen: comfortable, no distress Neuro: non-focal exam, f/c HEENT: PERRL Neck: supple CV: RRR Pulm: unlabored breathing Abd: soft, NT GU: clear yellow urine Extr: wwp, no edema   Results for orders placed or performed during the hospital encounter of 02/27/21 (from the past 24 hour(s))  Ethanol     Status: None   Collection Time: 02/27/21  2:35 PM  Result Value Ref Range   Alcohol, Ethyl (B) <10 <10 mg/dL  Lactic acid, plasma     Status: Abnormal   Collection Time: 02/27/21  2:35 PM  Result Value Ref Range   Lactic Acid, Venous 4.0 (HH) 0.5 - 1.9 mmol/L  Resp Panel by RT-PCR (Flu A&B, Covid) Nasopharyngeal Swab     Status: None   Collection Time: 02/27/21  2:47 PM   Specimen: Nasopharyngeal Swab; Nasopharyngeal(NP) swabs in vial transport medium  Result Value Ref Range   SARS Coronavirus 2 by RT PCR NEGATIVE NEGATIVE   Influenza A by PCR NEGATIVE NEGATIVE   Influenza B by PCR NEGATIVE NEGATIVE  Comprehensive metabolic panel     Status: Abnormal   Collection Time: 02/27/21  2:47 PM  Result Value Ref Range   Sodium 136 135 - 145 mmol/L   Potassium 3.5 3.5 - 5.1 mmol/L   Chloride 105 98 - 111 mmol/L   CO2 19 (L) 22 - 32 mmol/L   Glucose, Bld 164 (H) 70 - 99 mg/dL   BUN 10 6 - 20 mg/dL    Creatinine, Ser 3.23 0.44 - 1.00 mg/dL   Calcium 8.9 8.9 - 55.7 mg/dL   Total Protein 7.2 6.5 - 8.1 g/dL   Albumin 3.6 3.5 - 5.0 g/dL   AST 322 (H) 15 - 41 U/L   ALT 52 (H) 0 - 44 U/L   Alkaline Phosphatase 81 38 - 126 U/L   Total Bilirubin 0.4 0.3 - 1.2 mg/dL   GFR, Estimated >02 >54 mL/min   Anion gap 12 5 - 15  CBC     Status: Abnormal   Collection Time: 02/27/21  2:47 PM  Result Value Ref Range   WBC 23.0 (H) 4.0 - 10.5 K/uL   RBC 3.87 3.87 - 5.11 MIL/uL   Hemoglobin 9.6 (L) 12.0 - 15.0 g/dL   HCT 27.0 (L) 62.3 - 76.2 %   MCV 79.8 (L) 80.0 - 100.0 fL   MCH 24.8 (L) 26.0 - 34.0 pg   MCHC 31.1 30.0 - 36.0 g/dL   RDW 83.1 51.7 - 61.6 %   Platelets 380 150 - 400 K/uL   nRBC 0.0 0.0 - 0.2 %  Protime-INR     Status: None   Collection Time: 02/27/21  2:47 PM  Result Value Ref Range   Prothrombin Time 12.2 11.4 - 15.2 seconds  INR 0.9 0.8 - 1.2  Sample to Blood Bank     Status: None   Collection Time: 02/27/21  3:01 PM  Result Value Ref Range   Blood Bank Specimen SAMPLE AVAILABLE FOR TESTING    Sample Expiration      02/27/2021,2359 Performed at [facility], [address] [place], [place] [address]   Type and screen [facility]     Status: None   Collection Time: 02/27/21  3:01 PM  Result Value Ref Range   ABO/RH(D) O POS    Antibody Screen NEG    Sample Expiration      03/02/2021,2359 Performed at [facility], [address], [place], [place] [address]   I-Stat Chem 8, ED     Status: Abnormal   Collection Time: 02/27/21  3:03 PM  Result Value Ref Range   Sodium 139 135 - 145 mmol/L   Potassium 3.8 3.5 - 5.1 mmol/L   Chloride 106 98 - 111 mmol/L   BUN 10 6 - 20 mg/dL   Creatinine, Ser 0.98 0.44 - 1.00 mg/dL   Glucose, Bld 119 (H) 70 - 99 mg/dL   Calcium, Ion 1.47 8.29 - 1.40 mmol/L   TCO2 20 (L) 22 - 32 mmol/L   Hemoglobin 10.5 (L) 12.0 - 15.0 g/dL   HCT 56.2 (L) 13.0 - 86.5 %  MRSA Next Gen by PCR, Nasal     Status: None    Collection Time: 02/27/21  8:49 PM   Specimen: Nasal Mucosa; Nasal Swab  Result Value Ref Range   MRSA by PCR Next Gen NOT DETECTED NOT DETECTED  HIV Antibody (routine testing w rflx)     Status: None   Collection Time: 02/27/21  9:29 PM  Result Value Ref Range   HIV Screen 4th Generation wRfx Non Reactive Non Reactive  Magnesium     Status: None   Collection Time: 02/27/21  9:29 PM  Result Value Ref Range   Magnesium 1.9 1.7 - 2.4 mg/dL  Phosphorus     Status: None   Collection Time: 02/27/21  9:29 PM  Result Value Ref Range   Phosphorus 4.4 2.5 - 4.6 mg/dL  CK     Status: Abnormal   Collection Time: 02/27/21  9:29 PM  Result Value Ref Range   Total CK 668 (H) 38 - 234 U/L  ABO/Rh     Status: None   Collection Time: 02/27/21  9:30 PM  Result Value Ref Range   ABO/RH(D)      O POS Performed at [facility], [address] [place], [place] [address]   Urinalysis, Routine w reflex microscopic Urine, Catheterized     Status: Abnormal   Collection Time: 02/28/21  3:25 AM  Result Value Ref Range   Color, Urine YELLOW YELLOW   APPearance HAZY (A) CLEAR   Specific Gravity, Urine 1.043 (H) 1.005 - 1.030   pH 5.0 5.0 - 8.0   Glucose, UA NEGATIVE NEGATIVE mg/dL   Hgb urine dipstick NEGATIVE NEGATIVE   Bilirubin Urine NEGATIVE NEGATIVE   Ketones, ur NEGATIVE NEGATIVE mg/dL   Protein, ur NEGATIVE NEGATIVE mg/dL   Nitrite NEGATIVE NEGATIVE   Leukocytes,Ua NEGATIVE NEGATIVE   RBC / HPF 0-5 0 - 5 RBC/hpf   WBC, UA 0-5 0 - 5 WBC/hpf   Bacteria, UA RARE (A) NONE SEEN   Squamous Epithelial / LPF 0-5 0 - 5   Mucus PRESENT    Uric Acid Crys,  UA PRESENT   Urine rapid drug screen (hosp performed)     Status: Abnormal   Collection Time: 02/28/21  3:25 AM  Result Value Ref Range   Opiates POSITIVE (A) NONE DETECTED   Cocaine NONE DETECTED NONE DETECTED   Benzodiazepines NONE DETECTED NONE DETECTED   Amphetamines NONE DETECTED NONE DETECTED   Tetrahydrocannabinol NONE DETECTED  NONE DETECTED   Barbiturates NONE DETECTED NONE DETECTED  CBC     Status: Abnormal   Collection Time: 02/28/21  3:44 AM  Result Value Ref Range   WBC 12.2 (H) 4.0 - 10.5 K/uL   RBC 3.37 (L) 3.87 - 5.11 MIL/uL   Hemoglobin 8.3 (L) 12.0 - 15.0 g/dL   HCT 96.7 (L) 59.1 - 63.8 %   MCV 79.2 (L) 80.0 - 100.0 fL   MCH 24.6 (L) 26.0 - 34.0 pg   MCHC 31.1 30.0 - 36.0 g/dL   RDW 46.6 59.9 - 35.7 %   Platelets 287 150 - 400 K/uL   nRBC 0.0 0.0 - 0.2 %  Basic metabolic panel     Status: Abnormal   Collection Time: 02/28/21  3:44 AM  Result Value Ref Range   Sodium 137 135 - 145 mmol/L   Potassium 3.9 3.5 - 5.1 mmol/L   Chloride 106 98 - 111 mmol/L   CO2 23 22 - 32 mmol/L   Glucose, Bld 159 (H) 70 - 99 mg/dL   BUN 11 6 - 20 mg/dL   Creatinine, Ser 0.17 0.44 - 1.00 mg/dL   Calcium 8.5 (L) 8.9 - 10.3 mg/dL   GFR, Estimated >79 >39 mL/min   Anion gap 8 5 - 15  CK     Status: Abnormal   Collection Time: 02/28/21  3:44 AM  Result Value Ref Range   Total CK 653 (H) 38 - 234 U/L    Assessment & Plan: The plan of care was discussed with the bedside nurse for the day, who is in agreement with this plan and no additional concerns were raised.   Present on Admission:  Thoracic spine fracture (HCC)    LOS: 1 day   Additional comments:I reviewed the patient's new clinical lab test results.   and I reviewed the patients new imaging test results.    Fall down stairs   T6 VB fx and T7 burst fx with mild retropulsion - log roll only, pain control, NSGY c/s, Dr. Jake Samples, to OR today Posterior Mediastinal hematoma - monitor clinically, likely 2/2 to spinal fractures Epilepsy - restart home medications, PRN ativan FEN - diet post op DVT - SCDs, SQH post-op per direction of NSGY Dispo - ICU   Attempt at informed consent using Spanish interpreter Anastasia Pall [phone]. Risks and benefits explained by Dr. Jake Samples in detail and opportunity for questions provided. Unclear if patient understands fully due  to language barrier, medical literacy, and pain/medications, but I do agree with Dr. Jake Samples that this is an emergent procedure and should proceed with or without definitive informed consent. Patient has no reachable family other than two underage children. Dr. Jake Samples has attempted to reach the patient's sister by phone multiple times at the number listed in the chart. He was able to reach the patient's friend and roommate, Asher Muir, who also agrees with proceeding with surgery.    Diamantina Monks, MD Trauma & General Surgery Please use [email or web address] to contact on call provider  02/28/2021  *Care during the described time interval was provided by me. I have reviewed this patient's  available data, including medical history, events of note, physical examination and test results as part of my evaluation.

## 2021-02-28 NOTE — Progress Notes (Signed)
   Providing Compassionate, Quality Care - Together  NEUROSURGERY PROGRESS NOTE   S: No issues overnight.  O: EXAM:  BP 130/86 (BP Location: Right Arm)   Pulse (!) 130   Temp 99.3 F (37.4 C) (Axillary)   Resp 17   SpO2 93%   Awake, alert PERRL Speech fluent, appropriate, spanish speaking Face symmetric BUE 5/5 BLE 3/5 HF/KE, 4/5 DF/PF, somewhat pain limited   ASSESSMENT:  35 y.o. female with   T6 left pedicle and posterior inferior vertebral body fracture T7 burst fracture with 3 column injury and posterior element fractures, unstable Epilepsy   Plan:  -mri reviewed -N.p.o. -multiple attempts to contact family without success, d/w friend who lives with patient, Marijean Niemann, about the fracture and plan for surgery -pt s/e with interpretor, explained risks/benefits/recommendation of surgery, she agrees. I answered her questions, verbal consent was obtained with interpretor -Strict logroll only -OR today for T4-9 fusion, T7 corpectomy, T6-8 decompression -will start dvt ppx tomorrow with [facility]    Thank you for allowing me to participate in this patient's care.  Please do not hesitate to call with questions or concerns.   Monia Pouch, DO Neurosurgeon [facility] Cell: [phone]

## 2021-02-28 NOTE — Anesthesia Procedure Notes (Signed)
Procedure Name: Intubation Date/Time: 02/28/2021 1:12 PM Performed by: Colon Flattery, CRNA Pre-anesthesia Checklist: Patient identified, Emergency Drugs available, Suction available and Patient being monitored Patient Re-evaluated:Patient Re-evaluated prior to induction Oxygen Delivery Method: Circle system utilized Preoxygenation: Pre-oxygenation with 100% oxygen Induction Type: IV induction Ventilation: Mask ventilation without difficulty Laryngoscope Size: Glidescope and 3 Grade View: Grade I Tube type: Oral Tube size: 7.0 mm Number of attempts: 1 Airway Equipment and Method: Stylet and Oral airway Placement Confirmation: ETT inserted through vocal cords under direct vision, positive ETCO2 and breath sounds checked- equal and bilateral Secured at: 22 cm Tube secured with: Tape Dental Injury: Teeth and Oropharynx as per pre-operative assessment  Comments: Head and neck remain stabilized with C collar for intubation

## 2021-02-28 NOTE — Progress Notes (Signed)
Received a call from Rappa (states he is the patient's brother) this morning who was asking for a patient update. There is no password but patient's sister has been the main contact and Rappa is not in the patient's contact list. This RN called the sister to give her an update and ask about the brother Rappa. Patient's sister states Rappa is her brother and it is okay to give information on the patient to the brother, Rappa Nokes. Will call brother back to give him an update on the patient.

## 2021-02-28 NOTE — Progress Notes (Signed)
Patient CHG wiped, saline locked the IV. Patient stable and transported to surgery at this time. Report given to short stay RN And RN was in the patient room in short stay to receive the patient on arrival.

## 2021-02-28 NOTE — Anesthesia Preprocedure Evaluation (Addendum)
Anesthesia Evaluation  Patient identified by MRN, date of birth, ID band Patient awake    Reviewed: Allergy & Precautions, NPO status , Patient's Chart, lab work & pertinent test results  Airway Mallampati: IV  TM Distance: >3 FB Neck ROM: Limited  Mouth opening: Limited Mouth Opening Comment: c-collar in place. Pt not very cooperative with airway exam. No facial/jaw fractures in CT. Dental  (+) Dental Advisory Given, Teeth Intact   Pulmonary neg pulmonary ROS,    Pulmonary exam normal breath sounds clear to auscultation       Cardiovascular negative cardio ROS Normal cardiovascular exam Rhythm:Regular Rate:Normal     Neuro/Psych Seizures -,     GI/Hepatic negative GI ROS, Neg liver ROS,   Endo/Other  negative endocrine ROS  Renal/GU negative Renal ROS     Musculoskeletal negative musculoskeletal ROS (+)   Abdominal (+) + obese,   Peds  Hematology negative hematology ROS (+)   Anesthesia Other Findings   Reproductive/Obstetrics                           Anesthesia Physical Anesthesia Plan  ASA: 2  Anesthesia Plan: General   Post-op Pain Management:    Induction: Intravenous  PONV Risk Score and Plan: 4 or greater and Ondansetron, Dexamethasone, Treatment may vary due to age or medical condition, Midazolam, Diphenhydramine and Propofol infusion  Airway Management Planned: Oral ETT and Video Laryngoscope Planned  Additional Equipment:   Intra-op Plan:   Post-operative Plan: Extubation in OR  Informed Consent: I have reviewed the patients History and Physical, chart, labs and discussed the procedure including the risks, benefits and alternatives for the proposed anesthesia with the patient or authorized representative who has indicated his/her understanding and acceptance.     Dental advisory given  Plan Discussed with: CRNA  Anesthesia Plan Comments: (2 x PIV, +/- a-line)     Anesthesia Quick Evaluation

## 2021-02-28 NOTE — Progress Notes (Signed)
Morning assessment completed with the assistance of spanish interpreter ID [phone]. Patient informed of CT scan and surgery. This RN also reinforced the education on the PCA pump.

## 2021-02-28 NOTE — Transfer of Care (Signed)
Immediate Anesthesia Transfer of Care Note  Patient: Destinee Taber  Procedure(s) Performed: THORACIC FOUR-THORACIC NINE INSTRUMENTATION FUSION WITH THORACIC SEVEN CORPECTOMY  THORACIC SIX-THORACIC EIGHT DECOMPRESSION  Patient Location: PACU  Anesthesia Type:General  Level of Consciousness: drowsy  Airway & Oxygen Therapy: Patient Spontanous Breathing and Patient connected to face mask oxygen  Post-op Assessment: Report given to RN and Post -op Vital signs reviewed and stable  Post vital signs: stable  Last Vitals:  Vitals Value Taken Time  BP    Temp    Pulse    Resp    SpO2      Last Pain:  Vitals:   02/28/21 1038  TempSrc: Oral  PainSc: 10-Worst pain ever      Patients Stated Pain Goal: 3 (02/28/21 1038)  Complications: No notable events documented.

## 2021-02-28 NOTE — Op Note (Signed)
Providing Compassionate, Quality Care - Together  Date of service: 02/28/2021  PREOP DIAGNOSIS:  T7 Burst Fracture, unstable  with posterior element fractures and dislocation T6 pedicle fracture and posterior element fracture Fall due to seizure Seizures   POSTOP DIAGNOSIS: Same  PROCEDURE: T7 corpectomy, greater than 50%, posterior costotransversectomy approach Placement of anterior biomechanical device, T7 corpectomy device, K2M Capri 13 x 16 expandable cage T4-5, T5-6, T6-7, T7-8, T8-9 posterior lateral instrumentation and arthrodesis Bilateral segmental pedicle screw instrumentation T4 (5.5 mm x 40), T5 (5.5 mm x 40 mm), unilateral T6 on the right (6.5 x 45 mm), bilateral T8 (6.5 x 45 mm), bilateral T9 (6.5 x 45 mm), K2M Everest system Bilateral T6, T7 and T8 laminectomies for decompression of neural elements Intraoperative use of autograft, same incision Intraoperative use of allograft, DBM Intraoperative use of neuro monitoring, SSEPs, greater than 1 hour Intraoperative use of fluoroscopy, greater than 1 hour  SURGEON: Dr. Kendell Bane C. Mathis Cashman, DO  ASSISTANT: Dr. Coletta Memos, MD  ANESTHESIA: General Endotracheal  EBL: 800cc  SPECIMENS: none  DRAINS: medium hemovac, epidural space  COMPLICATIONS: None  CONDITION: hemodynamically stable  HISTORY: Laura Wyatt is a 35 y.o. female with a history of seizures, that presented after a fall off a 12 foot ladder due to having a seizure.  Unfortunately she has been off her seizure meds for a few days.  She complained of severe back pain and trauma work-up was initiated.  She was found to have a severe T7 burst fracture with some retropulsion, posterior elements fracture and T6 pedicle fracture and laminar fractures.  Due to the severity of this and instability, I recommended surgical decompression, instrumentation and fusion with a T7 corpectomy for anterior column support due to the severity of the burst fracture.  All risks,  benefits And outcomes were discussed and agreed upon with the patient via translator.  I attempted multiple times to get a hold of her sister without success.  I did discuss this with her co-resident, best friend Asher Muir.  Answered all of their questions.  PROCEDURE IN DETAIL: The patient was brought to the operating room. After induction of general anesthesia, prepositional SSEPs were obtained and noted to be monitorable.  The patient was positioned on the operative table in the prone position, post positional SSEPs were noted to be stable. All pressure points were meticulously padded. Skin incision was then marked out and prepped and draped in the usual sterile fashion.  Physician driven timeout was performed  Using a 10 blade, midline incision was made over the T4-T9 spinous processes.  Soft tissue dissection was performed down to the thoracic fascia using Bovie electrocautery.  Bilateral subperiosteal dissection was performed to expose the T4-T9 lamina and transverse processes using Bovie electrocautery.  Of note there was several posterior element fractures at T6 and T7.  Deep retractors were placed in the wound.  Lateral fluoroscopy confirmed appropriate levels.  Using AP and lateral fluoroscopy, Jamshidi was used to access the T4 pedicles bilaterally and K wires were placed.  This was repeated bilaterally for T5, and on the right at T6.  This was performed bilaterally at T8 and T9.  Pedicle screws were then placed over the K wires on the patient's left at T4 and T9.  Temporary rod was then measured and placed and setscrews were tightened for stability during decompression and corpectomy.  The microscope was then sterilely draped and brought into the field for the remainder the procedure.  Using a high-speed drill and  collecting the patient's bone from the decompression, a T8 bilateral superior laminectomy was performed down to the epidural space.  A complete bilateral T7 laminectomy was performed.  And a  bilateral T6 laminectomy was completely performed as well.  The ligamentum flavum was identified.  A right facetectomy at T7-T8 and T6-7 was performed using high-speed drill and Kerrison rongeurs.  The transverse process of T7 on the right was then removed as well as the medial portion of the rib.  Using a high-speed drill the right T7 pedicle was removed.  Using Kerrison rongeurs and micro curettes, the ligamentum flavum was elevated at T6, T7 and T8 and resected.  The epidural space was identified.  The right T7 nerve root was identified, ligated with 2-0 silk ties and 15 blade.  This was gently retracted medially.  Using a high-speed drill, the T7 vertebral body was accessed and removed with a series of curettes, pituitary rongeurs and the high-speed drill.  All of this autologous bone was collected for later use.  Using micro curettes, the epidural space was gently dissected behind the vertebral body of T7.  Epidural hemostasis was achieved with bipolar cautery.  Using downgoing curettes the posterior vertebral body was gently tamped ventrally.  This was removed with pituitary rongeurs.  The T6-7 and T7-8 discs were identified.  They were coagulated with bipolar cautery and incised with a 15 blade.  A radical T6-7 and T7-8 discectomy was performed with pituitary rongeurs and micro curettes to the endplates.  The cartilaginous layers were removed to reveal subchondral bleeding bone.  Once greater than 50% of the T7 vertebral body was removed, the space was trialed for the corpectomy cage.  Hemostasis was achieved with Surgiflo and cottonoids.  Using AP and lateral fluoroscopy the above corpectomy cage was selected.  It was filled with a mixture of autograft and allograft.  This was placed under live fluoroscopy and then expanded to 21 mm of height from a costotransversectomy approach.  A mixture of auto and allograft was placed laterally to the corpectomy cage.  Lateral and AP fluoroscopy confirmed appropriate  placement and reduction of the patient's kyphosis from the fracture.  The nerve root was gently taken out of retraction.  Epidural hemostasis was achieved with Surgiflo.  The temporary rod was removed.  Attention was then turned to placement of the remainder of the pedicle screws.  The above selected pedicle screws were then placed over the wires at T4 on the right, T5 bilaterally, T6 on the right, T8 bilaterally and T9 on the right.  The temporary rod was able to then be used for rod placement on the patient's left.  The right was then measured and rod was selected and contoured appropriately.  Setscrews were placed and final tightened to the manufacturer's recommendation.  The T4-5, T5-6 facets were decorticated with a high-speed drill bilaterally.  The T8-9 facet was decorticated bilaterally with a high-speed drill.  A mixture of autograft and allograft was laid down the posterior lateral gutters.  Hemostasis was achieved with bipolar cautery and Surgi-Flo.  Deep retractors were taken of the wound.  Wound was monitored and noted to be excellently hemostatic.  Medium Hemovac was tunneled inferiorly and placed in the epidural space.  The wound was then closed in layers with 0 Vicryl sutures for the muscle and fascia.  2-0 Vicryl sutures were for the dermis.  Skin was closed with staples.  Sterile dressing was applied.  During the entirety of the case, SSEPs remained stable.  At the end of the case all sponge, needle, and instrument counts were correct. The patient was then transferred to the stretcher, extubated, and taken to the post-anesthesia care unit in stable hemodynamic condition.

## 2021-02-28 NOTE — TOC CAGE-AID Note (Signed)
Transition of Care [facility]) - CAGE-AID Screening   Patient Details  Name: Laura Wyatt MRN: [id number] Date of Birth: 03-06-1986  Transition of Care [facility]) CM/SW Contact:    Ruairi Stutsman C Tarpley-Carter, LCSWA Phone Number: 02/28/2021, 2:37 PM   Clinical Narrative: Pt is unable to participate in Cage Aid.  Pt was in surgery.  Brunette Lavalle Tarpley-Carter, MSW, LCSW-A Pronouns:  She/Her/Hers [facility] HealthTransitions of Care Clinical Social Worker Direct Number:  [phone] [email or web address]   CAGE-AID Screening: Substance Abuse Screening unable to be completed due to: : Patient unable to participate (Pt was in surgery.)             Substance Abuse Education Offered: No

## 2021-02-28 NOTE — Anesthesia Postprocedure Evaluation (Signed)
Anesthesia Post Note  Patient: Laura Wyatt  Procedure(s) Performed: THORACIC FOUR-THORACIC NINE INSTRUMENTATION FUSION WITH THORACIC SEVEN CORPECTOMY  THORACIC SIX-THORACIC EIGHT DECOMPRESSION     Patient location during evaluation: PACU Anesthesia Type: General Level of consciousness: awake and alert Pain management: pain level controlled Vital Signs Assessment: post-procedure vital signs reviewed and stable Respiratory status: spontaneous breathing, nonlabored ventilation, respiratory function stable and patient connected to nasal cannula oxygen Cardiovascular status: blood pressure returned to baseline, stable and tachycardic Postop Assessment: no apparent nausea or vomiting Anesthetic complications: no   No notable events documented.  Last Vitals:  Vitals:   02/28/21 1935 02/28/21 1950  BP: 133/86 122/84  Pulse: (!) 103 (!) 108  Resp: (!) 21 20  Temp:  (!) 36.3 C  SpO2: 95% 95%    Last Pain:  Vitals:   02/28/21 1935  TempSrc:   PainSc: Asleep                 Beryle Lathe

## 2021-03-01 LAB — TYPE AND SCREEN
ABO/RH(D): O POS
Antibody Screen: NEGATIVE
Unit division: 0
Unit division: 0

## 2021-03-01 LAB — BPAM RBC
Blood Product Expiration Date: 202210212359
Blood Product Expiration Date: 202210222359
ISSUE DATE / TIME: 202209231324
ISSUE DATE / TIME: 202209231324
Unit Type and Rh: 5100
Unit Type and Rh: 5100

## 2021-03-01 MED ORDER — METHOCARBAMOL 500 MG PO TABS
1000.0000 mg | ORAL_TABLET | Freq: Four times a day (QID) | ORAL | Status: DC
  Administered 2021-03-01 – 2021-03-04 (×14): 1000 mg via ORAL
  Filled 2021-03-01 (×14): qty 2

## 2021-03-01 MED ORDER — HYDROMORPHONE HCL 1 MG/ML IJ SOLN: 0.5000 mg | INTRAMUSCULAR | Status: DC | PRN

## 2021-03-01 MED ORDER — LACTATED RINGERS IV SOLN: INTRAVENOUS | Status: DC

## 2021-03-01 MED ORDER — METHOCARBAMOL 1000 MG/10ML IJ SOLN
1000.0000 mg | Freq: Four times a day (QID) | INTRAVENOUS | Status: DC
  Filled 2021-03-01: qty 10

## 2021-03-01 MED ORDER — KETOROLAC TROMETHAMINE 15 MG/ML IJ SOLN
30.0000 mg | Freq: Four times a day (QID) | INTRAMUSCULAR | Status: DC
  Administered 2021-03-01 (×3): 30 mg via INTRAVENOUS
  Filled 2021-03-01 (×4): qty 2

## 2021-03-01 NOTE — Progress Notes (Signed)
PT Cancellation Note  Patient Details Name: Devota Viruet MRN: [id number] DOB: 1985-06-16   Cancelled Treatment:    Reason Eval/Treat Not Completed: Active bedrest order "Strict bedrest and log roll only." (Will return as schedule allows.)  Tiaunna Buford A. Dan Humphreys PT, DPT Acute Rehabilitation Services Pager [phone] Office [phone]    Viviann Spare 03/01/2021, 7:42 AM

## 2021-03-01 NOTE — Progress Notes (Signed)
Patient ID: Laura Wyatt, female   DOB: 10/15/1985, 35 y.o.   MRN: [id number] BP (!) 114/96 (BP Location: Right Arm)   Pulse (!) 124   Temp 98.1 F (36.7 C) (Oral)   Resp 18   Ht 5\' 5"  (1.651 m)   Wt 87.4 kg   SpO2 92%   BMI 32.06 kg/m  Alert and oriented x 4, speech is clear , fluent Moving lower extremities well Wound is clean, no signs of infection Reports pain, which is expected

## 2021-03-01 NOTE — Progress Notes (Signed)
Trauma/Critical Care Follow Up Note  Subjective:    Overnight Issues:   Objective:  Vital signs for last 24 hours: Temp:  [97.3 F (36.3 C)-99.7 F (37.6 C)] 98.1 F (36.7 C) (09/24 0800) Pulse Rate:  [97-136] 119 (09/24 0600) Resp:  [13-22] 15 (09/24 0739) BP: (108-141)/(69-99) 114/78 (09/24 0600) SpO2:  [92 %-100 %] 94 % (09/24 0739) FiO2 (%):  [28 %] 28 % (09/24 0410) Weight:  [87.4 kg] 87.4 kg (09/23 1038)  Hemodynamic parameters for last 24 hours:    Intake/Output from previous day: 09/23 0701 - 09/24 0700 In: 7124.4 [I.V.:5419.3; Blood:630; IV Piggyback:1050.1] Out: 3660 [Urine:2650; Drains:210; Blood:800]  Intake/Output this shift: No intake/output data recorded.  Vent settings for last 24 hours: FiO2 (%):  [28 %] 28 %  Physical Exam:  Gen: comfortable, no distress Neuro: non-focal exam HEENT: PERRL Neck: supple CV: RRR Pulm: unlabored breathing Abd: soft, abd TTP GU: clear yellow urine Extr: wwp, no edema   Results for orders placed or performed during the hospital encounter of 02/27/21 (from the past 24 hour(s))  Pregnancy, urine POC     Status: None   Collection Time: 02/28/21 10:45 AM  Result Value Ref Range   Preg Test, Ur NEGATIVE NEGATIVE  Prepare RBC (crossmatch)     Status: None   Collection Time: 02/28/21  1:09 PM  Result Value Ref Range   Order Confirmation      ORDER PROCESSED BY BLOOD BANK Performed at [facility], [address] [place], [place] [address]   POCT I-Stat EG7     Status: Abnormal   Collection Time: 02/28/21  3:39 PM  Result Value Ref Range   pH, Ven 7.307 7.250 - 7.430   pCO2, Ven 41.3 (L) 44.0 - 60.0 mmHg   pO2, Ven 49.0 (H) 32.0 - 45.0 mmHg   Bicarbonate 20.6 20.0 - 28.0 mmol/L   TCO2 22 22 - 32 mmol/L   O2 Saturation 81.0 %   Acid-base deficit 5.0 (H) 0.0 - 2.0 mmol/L   Sodium 136 135 - 145 mmol/L   Potassium 4.4 3.5 - 5.1 mmol/L   Calcium, Ion 1.11 (L) 1.15 - 1.40 mmol/L   HCT 28.0 (L) 36.0 - 46.0  %   Hemoglobin 9.5 (L) 12.0 - 15.0 g/dL   Patient temperature 57.3 C    Sample type VENOUS   CK     Status: Abnormal   Collection Time: 02/28/21  8:23 PM  Result Value Ref Range   Total CK 1,224 (H) 38 - 234 U/L  CBC     Status: Abnormal   Collection Time: 02/28/21  8:23 PM  Result Value Ref Range   WBC 11.6 (H) 4.0 - 10.5 K/uL   RBC 3.36 (L) 3.87 - 5.11 MIL/uL   Hemoglobin 8.9 (L) 12.0 - 15.0 g/dL   HCT 22.0 (L) 25.4 - 27.0 %   MCV 81.5 80.0 - 100.0 fL   MCH 26.5 26.0 - 34.0 pg   MCHC 32.5 30.0 - 36.0 g/dL   RDW 62.3 76.2 - 83.1 %   Platelets 224 150 - 400 K/uL   nRBC 0.0 0.0 - 0.2 %  Creatinine, serum     Status: None   Collection Time: 02/28/21  8:23 PM  Result Value Ref Range   Creatinine, Ser 0.58 0.44 - 1.00 mg/dL   GFR, Estimated >51 >76 mL/min    Assessment & Plan:  Present on Admission:  Thoracic spine fracture (HCC)    LOS: 2  days   Additional comments:I reviewed the patient's new clinical lab test results.   and I reviewed the patients new imaging test results.    Fall down stairs   T6 VB fx and T7 burst fx with mild retropulsion - NSGY c/s, Dr. Jake Samples, to OR for T4-9 fusion and T7 corpectomy, TLSO when OOB. Adjusted pain regimen. Posterior Mediastinal hematoma - monitor clinically, likely 2/2 to spinal fractures Epilepsy - restarted home medications, PRN ativan FEN - reg diet DVT - SCDs, SQH to start 9/25 per NSGY (ordered) Foley - remove at 2200 tonight Dispo - 4NP  Diamantina Monks, MD Trauma & General Surgery Please use [email or web address] to contact on call provider  03/01/2021  *Care during the described time interval was provided by me. I have reviewed this patient's available data, including medical history, events of note, physical examination and test results as part of my evaluation.

## 2021-03-01 NOTE — Progress Notes (Signed)
OT Cancellation Note  Patient Details Name: Laura Wyatt MRN: [id number] DOB: 1985/12/08   Cancelled Treatment:    Reason Eval/Treat Not Completed: Active bedrest order. "Strict bedrest and log roll only." (Will return as schedule allows.)  Canio Winokur M [name] Juanisha Bautch MSOT, OTR/L Acute Rehab Pager: [phone] Office: [phone] 03/01/2021, 6:44 AM

## 2021-03-01 NOTE — Progress Notes (Signed)
20 mL hydromorphone (Dilaudid) wasted from PCA syringe and tubing with Gentry Roch, RN in [employer] bin on [employer].

## 2021-03-01 NOTE — Plan of Care (Signed)
  Problem: Education: Goal: Knowledge of General Education information will improve Description: Including pain rating scale, medication(s)/side effects and non-pharmacologic comfort measures Outcome: Progressing   Problem: Clinical Measurements: Goal: Respiratory complications will improve Outcome: Progressing   Problem: Health Behavior/Discharge Planning: Goal: Ability to manage health-related needs will improve Outcome: Not Progressing   Problem: Nutrition: Goal: Adequate nutrition will be maintained Outcome: Not Progressing

## 2021-03-02 LAB — URINE CULTURE: Culture: 40000 — AB

## 2021-03-02 MED ORDER — KETOROLAC TROMETHAMINE 30 MG/ML IJ SOLN
30.0000 mg | Freq: Four times a day (QID) | INTRAMUSCULAR | Status: DC
  Administered 2021-03-02 – 2021-03-04 (×11): 30 mg via INTRAVENOUS
  Filled 2021-03-02 (×11): qty 1

## 2021-03-02 NOTE — Progress Notes (Signed)
Patient ID: Laura Wyatt, female   DOB: Oct 28, 1985, 35 y.o.   MRN: [id number] BP 122/64 (BP Location: Right Arm)   Pulse (!) 123   Temp 99.3 F (37.4 C) (Oral)   Resp (!) 24   Ht 5\' 5"  (1.651 m)   Wt 87.4 kg   SpO2 94%   BMI 32.06 kg/m  Alert and oriented x 4, speech is clear and fluent Moving lower extremities Wound is clean, dry, no signs of infection Drain 150cc yesterday, will leave in place for now

## 2021-03-02 NOTE — Progress Notes (Signed)
Orthopedic Tech Progress Note Patient Details:  Laura Wyatt 08/10/1985 [id number]  Ortho Devices Type of Ortho Device: Other (comment) Ortho Device/Splint Location: TLSO. delivered brace to room. Ortho Device/Splint Interventions: Ordered, Application, Adjustment   Post Interventions Patient Tolerated: Well Instructions Provided: Care of device, Adjustment of device  Trinna Post 03/02/2021, 3:50 AM

## 2021-03-02 NOTE — Evaluation (Signed)
Physical Therapy Evaluation Patient Details Name: Laura Wyatt MRN: [id number] DOB: 1986/03/17 Today's Date: 03/02/2021  History of Present Illness  She fell off a ladder (10-12 feet) because she had a seizure due to epilepsy.Found to have T 7 burst fracture with mild retropulsion, T6 VB fracture, and posterior Mediastinal hematoma. S/p T4-9 fusion, T7 corpectomy on 02/28/2021   Clinical Impression  Pt admitted with above diagnosis. PTA pt lived at home with her sons, independent and active. On eval, she required mod assist bed mobility, min assist sit to stand, and min assist SPT. Dependent with donning TLSO EOB. SpO2 95% on RA. Max HR in 120s. Pt will benefit from skilled PT to increase their independence and safety with mobility to allow discharge to the venue listed below.  Depending on LOB pt may progress mobility to require no follow up services at d/c. Pt's sons report pt's family is in town from [place]. Family is considering taking her back to [place] upon d/c.         Recommendations for follow up therapy are one component of a multi-disciplinary discharge planning process, led by the attending physician.  Recommendations may be updated based on patient status, additional functional criteria and insurance authorization.  Follow Up Recommendations Home health PT;Supervision/Assistance - 24 hour    Equipment Recommendations  Rolling walker with 5" wheels    Recommendations for Other Services       Precautions / Restrictions Precautions Precautions: Fall;Back Precaution Comments: educated on 3/3 back precautions Required Braces or Orthoses: Spinal Brace Spinal Brace: Thoracolumbosacral orthotic;Applied in sitting position      Mobility  Bed Mobility Overal bed mobility: Needs Assistance Bed Mobility: Sidelying to Sit   Sidelying to sit: HOB elevated;Mod assist       General bed mobility comments: cues for sequencing, increased time, assist to elevate trunk and scoot to EOB     Transfers Overall transfer level: Needs assistance Equipment used: 1 person hand held assist Transfers: Sit to/from Stand;Stand Pivot Transfers Sit to Stand: Min assist Stand pivot transfers: Min assist       General transfer comment: assist to power up and stabilize balance, pivot steps bed > BSC > recliner. Therapist anterior to pt with pt using therapist's forearms for support.  Ambulation/Gait             General Gait Details: not assessed due to pain and no RW in room. RW obtained at end of session and placed in room for future sessions.  Stairs            Wheelchair Mobility    Modified Rankin (Stroke Patients Only)       Balance Overall balance assessment: Needs assistance Sitting-balance support: No upper extremity supported;Feet unsupported Sitting balance-Leahy Scale: Good     Standing balance support: Bilateral upper extremity supported;Single extremity supported;During functional activity Standing balance-Leahy Scale: Fair Standing balance comment: static stand without UE support, support for dynamic stance                             Pertinent Vitals/Pain Pain Assessment: Faces Faces Pain Scale: Hurts even more Pain Location: back Pain Descriptors / Indicators: Grimacing;Guarding Pain Intervention(s): Limited activity within patient's tolerance;Monitored during session;Repositioned    Home Living Family/patient expects to be discharged to:: Private residence Living Arrangements: Children Available Help at Discharge: Family;Available 24 hours/day Type of Home: House Home Access: Stairs to enter   [employer] of Steps: 1 (threshold)  Home Layout: One level Home Equipment: None      Prior Function Level of Independence: Independent               Hand Dominance        Extremity/Trunk Assessment   Upper Extremity Assessment Upper Extremity Assessment: Defer to OT evaluation    Lower Extremity  Assessment Lower Extremity Assessment: Generalized weakness    Cervical / Trunk Assessment Cervical / Trunk Assessment: Other exceptions Cervical / Trunk Exceptions: s/p spinal sx  Communication   Communication: Prefers language other than [place];Interpreter utilized (sons present in room to interpret)  Cognition Arousal/Alertness: Awake/alert Behavior During Therapy: WFL for tasks assessed/performed Overall Cognitive Status: Within Functional Limits for tasks assessed                                        General Comments General comments (skin integrity, edema, etc.): SpO2 95% on RA. RN notified pt remained on RA at end of session. Max HR in 120s during mobility. BP stable.    Exercises     Assessment/Plan    PT Assessment Patient needs continued PT services  PT Problem List Decreased strength;Decreased mobility;Decreased knowledge of precautions;Decreased activity tolerance;Decreased balance;Decreased knowledge of use of DME;Pain       PT Treatment Interventions Functional mobility training;Balance training;Gait training;Therapeutic exercise;Patient/family education;DME instruction;Therapeutic activities    PT Goals (Current goals can be found in the Care Plan section)  Acute Rehab PT Goals Patient Stated Goal: home PT Goal Formulation: With patient/family Time For Goal Achievement: 03/16/21 Potential to Achieve Goals: Good    Frequency Min 6X/week   Barriers to discharge        Co-evaluation               AM-PAC PT "6 Clicks" Mobility  Outcome Measure Help needed turning from your back to your side while in a flat bed without using bedrails?: A Little Help needed moving from lying on your back to sitting on the side of a flat bed without using bedrails?: A Lot Help needed moving to and from a bed to a chair (including a wheelchair)?: A Little Help needed standing up from a chair using your arms (e.g., wheelchair or bedside chair)?: A  Little Help needed to walk in hospital room?: A Little Help needed climbing 3-5 steps with a railing? : A Lot 6 Click Score: 16    End of Session Equipment Utilized During Treatment: Gait belt;Back brace Activity Tolerance: Patient tolerated treatment well Patient left: in chair;with call bell/phone within reach;with family/visitor present Nurse Communication: Mobility status PT Visit Diagnosis: Other abnormalities of gait and mobility (R26.89);Pain Pain - part of body:  (back)    Time: 4656-8127 PT Time Calculation (min) (ACUTE ONLY): 38 min   Charges:   PT Evaluation $PT Eval Moderate Complexity: 1 Mod PT Treatments $Therapeutic Activity: 8-22 mins        Aida Raider, PT  Office # [phone] Pager [phone]   Ilda Foil 03/02/2021, 2:05 PM

## 2021-03-02 NOTE — Evaluation (Signed)
Occupational Therapy Evaluation Patient Details Name: Laura Wyatt MRN: [id number] DOB: [birth year] Today's Date: 03/02/2021   History of Present Illness She fell off a ladder (10-12 feet) because she had a seizure due to epilepsy.Found to have T 7 burst fracture with mild retropulsion, T6 VB fracture, and posterior Mediastinal hematoma. S/p T4-9 fusion, T7 corpectomy on 02/28/2021   Clinical Impression   This 35 yo female admitted and underwent above presents to acute OT with PLOF of being totally independent with basic ADLs, IADLs, working as a Surveyor, minerals, and raising 2 sons. She currently is setup/S-Max A for basic ADLs due to pain and back precautions. She will continue to benefit from acute OT without need for follow up.      Recommendations for follow up therapy are one component of a multi-disciplinary discharge planning process, led by the attending physician.  Recommendations may be updated based on patient status, additional functional criteria and insurance authorization.   Follow Up Recommendations  No OT follow up;Supervision/Assistance - 24 hour    Equipment Recommendations  3 in 1 bedside commode;Tub/shower seat (RW)           Precautions / Restrictions Precautions Precautions: Fall;Back Precaution Comments: educated on 3/3 back precautions Required Braces or Orthoses: Spinal Brace Spinal Brace: Thoracolumbosacral orthotic;Applied in sitting position Restrictions Weight Bearing Restrictions: No      Mobility Bed Mobility Overal bed mobility: Needs Assistance Bed Mobility: Sidelying to Sit;Sit to Sidelying   Sidelying to sit: HOB elevated;Mod assist (A for trunk)     Sit to sidelying: Mod assist (A for legs)     Transfers Overall transfer level: Needs assistance Equipment used: 1 person hand held assist Transfers: Sit to/from [employer] Sit to Stand: Min assist Stand pivot transfers: Min assist       Therapist anterior to pt  with pt using therapist's forearms for support.    Balance Overall balance assessment: Needs assistance Sitting-balance support: No upper extremity supported;Feet supported Sitting balance-Leahy Scale: Good     Standing balance support: Single extremity supported Standing balance-Leahy Scale: Poor Standing balance comment: static stand without UE support, support for dynamic stance                           ADL either performed or assessed with clinical judgement   ADL Overall ADL's : Needs assistance/impaired Eating/Feeding: Independent;Sitting   Grooming: Set up;Supervision/safety;Sitting   Upper Body Bathing: Set up;Supervision/ safety;Sitting   Lower Body Bathing: Moderate assistance Lower Body Bathing Details (indicate cue type and reason): min A sit<>stand Upper Body Dressing : Minimal assistance;Sitting   Lower Body Dressing: Maximal assistance Lower Body Dressing Details (indicate cue type and reason): min A sit<>stand Toilet Transfer: Minimal assistance Toilet Transfer Details (indicate cue type and reason): side step up towards [facility] Toileting- Clothing Manipulation and Hygiene: Moderate assistance Toileting - Clothing Manipulation Details (indicate cue type and reason): min A sit<>stand             Vision Baseline Vision/History: 0 No visual deficits Ability to See in Adequate Light: 0 Adequate                  Pertinent Vitals/Pain Pain Assessment: 0-10 Pain Score: 7  Faces Pain Scale: Hurts even more Pain Location: incisional site Pain Descriptors / Indicators: Grimacing;Guarding Pain Intervention(s): Limited activity within patient's tolerance;Monitored during session;Repositioned     Hand Dominance Right   Extremity/Trunk Assessment Upper Extremity Assessment Upper Extremity  Assessment: Generalized weakness      Communication Communication Communication: Prefers language other than [place];Interpreter utilized (tablet  interpretation)   Cognition Arousal/Alertness: Awake/alert Behavior During Therapy: WFL for tasks assessed/performed Overall Cognitive Status: Within Functional Limits for tasks assessed                                                Home Living Family/patient expects to be discharged to:: Private residence Living Arrangements: Children Available Help at Discharge: Family;Available 24 hours/day Type of Home: House Home Access: Stairs to enter [employer] of Steps: 1 (threshold)   Home Layout: One level     Bathroom Shower/Tub: Chief Strategy Officer: Standard     Home Equipment: None          Prior Functioning/Environment Level of Independence: Independent                 OT Problem List: Decreased strength;Decreased range of motion;Impaired balance (sitting and/or standing);Pain      OT Treatment/Interventions: Self-care/ADL training;DME and/or AE instruction;Patient/family education;Balance training    OT Goals(Current goals can be found in the care plan section) Acute Rehab OT Goals Patient Stated Goal: for back to get better 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) OT Goal Formulation: With patient Time For Goal Achievement: 03/16/21 Potential to Achieve Goals: Good  OT Frequency: Min 2X/week    AM-PAC OT "6 Clicks" Daily Activity     Outcome Measure Help from another person eating meals?: None Help from another person taking care of personal grooming?: A Little Help from another person toileting, which includes using toliet, bedpan, or urinal?: A Lot Help from another person bathing (including washing, rinsing, drying)?: A Lot Help from another person to put on and taking off regular upper body clothing?: A Little Help from another person to put on and taking off regular lower body clothing?: A Lot 6 Click Score: 16   End of Session    Activity Tolerance: Patient tolerated treatment well Patient left: in  bed;with call bell/phone within reach;with bed alarm set  OT Visit Diagnosis: Unsteadiness on feet (R26.81);Other abnormalities of gait and mobility (R26.89);Muscle weakness (generalized) (M62.81);Pain Pain - Right/Left:  (incisioanl) Pain - part of body:  (back)                Time: 4010-2725 OT Time Calculation (min): 20 min Charges:  OT General Charges $OT Visit: 1 Visit OT Evaluation $OT Eval Moderate Complexity: 1 Mod Ignacia Palma, OTR/L Acute [employer] Pager [phone] Office [phone]    Evette Georges 03/02/2021, 2:55 PM

## 2021-03-02 NOTE — Progress Notes (Signed)
   Trauma/Critical Care Follow Up Note  Subjective:    Overnight Issues:   Objective:  Vital signs for last 24 hours: Temp:  [97.7 F (36.5 C)-100.2 F (37.9 C)] 99.2 F (37.3 C) (09/25 0400) Pulse Rate:  [120-139] 120 (09/25 0600) Resp:  [15-34] 34 (09/25 0500) BP: (95-137)/(47-96) 112/79 (09/25 0600) SpO2:  [91 %-98 %] 93 % (09/25 0600) FiO2 (%):  [28 %] 28 % (09/24 1200)  Hemodynamic parameters for last 24 hours:    Intake/Output from previous day: 09/24 0701 - 09/25 0700 In: 1191 [I.V.:1001; IV Piggyback:190] Out: 950 [Urine:770; Drains:180]  Intake/Output this shift: Total I/O In: -  Out: 30 [Drains:30]  Vent settings for last 24 hours: FiO2 (%):  [28 %] 28 %  Physical Exam:  Gen: comfortable, no distress Neuro: non-focal exam HEENT: PERRL Neck: supple CV: RRR Pulm: unlabored breathing Abd: soft, NT GU: clear yellow urine Extr: wwp, no edema   No results found for this or any previous visit (from the past 24 hour(s)).  Assessment & Plan: The plan of care was discussed with the bedside nurse for the night, Badi, who is in agreement with this plan and no additional concerns were raised.   Present on Admission:  Thoracic spine fracture (HCC)    LOS: 3 days   Additional comments:I reviewed the patient's new clinical lab test results.   and I reviewed the patients new imaging test results.    Fall down stairs   T6 VB fx and T7 burst fx with mild retropulsion - NSGY c/s, Dr. Jake Samples, to OR for T4-9 fusion and T7 corpectomy, TLSO when OOB. Adjusted pain regimen. Posterior Mediastinal hematoma - monitor clinically, likely 2/2 to spinal fractures Epilepsy - restarted home medications, PRN ativan FEN - reg diet DVT - SCDs, SQH to start 9/25 per NSGY (ordered) Foley - spont voids Dispo - 4NP, PT/OT   Diamantina Monks, MD Trauma & General Surgery Please use [email or web address] to contact on call provider  03/02/2021  *Care during the described time interval  was provided by me. I have reviewed this patient's available data, including medical history, events of note, physical examination and test results as part of my evaluation.

## 2021-03-03 LAB — LEVETIRACETAM LEVEL: Levetiracetam Lvl: 25 ug/mL (ref 10.0–40.0)

## 2021-03-03 MED ORDER — ACETAMINOPHEN 500 MG PO TABS
1000.0000 mg | ORAL_TABLET | Freq: Four times a day (QID) | ORAL | Status: DC
  Administered 2021-03-03 – 2021-03-04 (×4): 1000 mg via ORAL
  Filled 2021-03-03 (×5): qty 2

## 2021-03-03 MED FILL — Thrombin For Soln 5000 Unit: CUTANEOUS | Qty: 5000 | Status: AC

## 2021-03-03 NOTE — Progress Notes (Signed)
   Trauma/Critical Care Follow Up Note  Subjective:    Overnight Issues:   Objective:  Vital signs for last 24 hours: Temp:  [97.8 F (36.6 C)-99.5 F (37.5 C)] 98.9 F (37.2 C) (09/26 1200) Pulse Rate:  [98-117] 101 (09/26 0800) Resp:  [15-26] 17 (09/26 0800) BP: (105-139)/(67-103) 130/95 (09/26 0800) SpO2:  [89 %-97 %] 93 % (09/26 0800)  Hemodynamic parameters for last 24 hours:    Intake/Output from previous day: 09/25 0701 - 09/26 0700 In: 1350.3 [I.V.:1150.3; IV Piggyback:200] Out: 130 [Drains:130]  Intake/Output this shift: Total I/O In: 126.6 [I.V.:126.6] Out: -   Vent settings for last 24 hours:    Physical Exam:  Gen: comfortable, no distress Neuro: non-focal exam HEENT: PERRL Neck: supple CV: RRR Pulm: unlabored breathing Abd: soft, NT GU: clear yellow urine Extr: wwp, no edema   No results found for this or any previous visit (from the past 24 hour(s)).  Assessment & Plan:  Present on Admission:  Thoracic spine fracture (HCC)    LOS: 4 days   Additional comments:I reviewed the patient's new clinical lab test results.   and I reviewed the patients new imaging test results.    Fall down stairs   T6 VB fx and T7 burst fx with mild retropulsion - NSGY c/s, Dr. Jake Samples, to OR for T4-9 fusion and T7 corpectomy, TLSO when OOB. Drain to remain today per NSGY Posterior Mediastinal hematoma - monitor clinically, likely 2/2 to spinal fractures Epilepsy - restarted home medications, PRN ativan FEN - reg diet DVT - SCDs, SQH to start 9/25 per NSGY (ordered) Foley - spont voids Dispo - 4NP, PT/OT, celared for discharge after drain removal  Diamantina Monks, MD Trauma & General Surgery Please use [email or web address] to contact on call provider  03/03/2021  *Care during the described time interval was provided by me. I have reviewed this patient's available data, including medical history, events of note, physical examination and test results as part of my  evaluation.

## 2021-03-03 NOTE — Progress Notes (Signed)
Subjective: Patient reports that she is doing well. She has some pain in her upper back. No acute events overnight.   Objective: Vital signs in last 24 hours: Temp:  [97.8 F (36.6 C)-99.4 F (37.4 C)] 99 F (37.2 C) (09/26 0400) Pulse Rate:  [98-117] 101 (09/26 0800) Resp:  [15-26] 17 (09/26 0800) BP: (104-139)/(67-103) 130/95 (09/26 0800) SpO2:  [89 %-97 %] 93 % (09/26 0800)  Intake/Output from previous day: 09/25 0701 - 09/26 0700 In: 1077.6 [I.V.:977.6; IV Piggyback:100] Out: 130 [Drains:130] Intake/Output this shift: No intake/output data recorded.    Physical Exam: Patient was asleep but easily arousable by voice. She was oriented x 4, conversant, and in good spirits. She was in NAD and VSS. Doing well. Speech is fluent and appropriate. MAEW. PERLA, EOMI. CNs grossly intact. Drain with 130 cc overnight. Dressing is clean dry intact. Incision is well approximated with no drainage, erythema, or edema.    Lab Results: Recent Labs    02/28/21 1539 02/28/21 2023  WBC  --  11.6*  HGB 9.5* 8.9*  HCT 28.0* 27.4*  PLT  --  224   BMET Recent Labs    02/28/21 1539 02/28/21 2023  NA 136  --   K 4.4  --   CREATININE  --  0.58    Studies/Results: No results found.  Assessment/Plan: 35 year old female who is s/p T4-9 fusion, T7 corpectomy on 02/28/2021 due to a fall wher she sustained a T6 left pedicle and posterior inferior vertebral body fracture and an unstable T7 burst fracture with 3 column injury and posterior element fractures. She is recovering well and is making slow progress. She is waiting transfer out of the ICU.    Plan:  -Pain control -TLSO brace when OOB -Continue PT/OT -Continue drain for today   LOS: 4 days     Council Mechanic, DNP, NP-C 03/03/2021, 8:33 AM

## 2021-03-03 NOTE — Progress Notes (Signed)
Physical Therapy Treatment Patient Details Name: Laura Wyatt MRN: [id number] DOB: 1985-07-14 Today's Date: 03/03/2021   History of Present Illness Pt fell off a ladder (10-12 feet) because she had a seizure due to epilepsy.Found to have T 7 burst fracture with mild retropulsion, T6 VB fracture, and posterior Mediastinal hematoma. S/p T4-9 fusion, T7 corpectomy on 02/28/2021    PT Comments    Pt progressing well. Focused on brace application, back precautions, ambulation and stair negotiation with family present. Pt continues to need help with bed mobility in order to adhere to back precautions and will need help donning TLSO, Jesus present with good understanding. Pt with improved ambulation tolerance despite 7/10 pain. Graciella, interpreter, used for session. Acute PT to cont to follow.    Recommendations for follow up therapy are one component of a multi-disciplinary discharge planning process, led by the attending physician.  Recommendations may be updated based on patient status, additional functional criteria and insurance authorization.  Follow Up Recommendations  Home health PT;Supervision/Assistance - 24 hour     Equipment Recommendations  Rolling walker with 5" wheels (youth walker)    Recommendations for Other Services       Precautions / Restrictions Precautions Precautions: Fall;Back Precaution Booklet Issued: Yes (comment) Precaution Comments: educated on 3/3 back precautions with Graciella, spanish interpreter present Required Braces or Orthoses: Spinal Brace Spinal Brace: Thoracolumbosacral orthotic;Applied in sitting position (Jesus present and verbal reported understanding on how to don it) Restrictions Weight Bearing Restrictions: No     Mobility  Bed Mobility Overal bed mobility: Needs Assistance Bed Mobility: Rolling;Sidelying to Sit Rolling: Mod assist Sidelying to sit: Mod assist       General bed mobility comments: HOB flat to mimic home, demod  to Jesus on how to assist pt into rolling and sitting EOB, as well as returning to bed as pt with increased pain limiting her ability to complete rolling    Transfers Overall transfer level: Needs assistance Equipment used: Rolling walker (2 wheeled) Transfers: Sit to/from Stand Sit to Stand: Min guard         General transfer comment: min guard for safety, increased time, verbal cues for safe hand placement  Ambulation/Gait Ambulation/Gait assistance: Min guard Gait Distance (Feet): 120 Feet Assistive device: Rolling walker (2 wheeled) Gait Pattern/deviations: Step-through pattern;Decreased stride length Gait velocity: slow and guarded   General Gait Details: verbal cues for hand placement on walker   Stairs Stairs: Yes Stairs assistance: Min assist Stair Management: Backwards;With walker (to mimic platform step) Number of Stairs: 1 General stair comments: pt with 1 step up on porch, Jesus present and with good return demo on how to assist pt on step going up backwards and goind down forwards   Wheelchair Mobility    Modified Rankin (Stroke Patients Only)       Balance Overall balance assessment: Needs assistance Sitting-balance support: No upper extremity supported;Feet supported Sitting balance-Leahy Scale: Good     Standing balance support: Bilateral upper extremity supported Standing balance-Leahy Scale: Poor Standing balance comment: need RW for safe amb                            Cognition Arousal/Alertness: Awake/alert Behavior During Therapy: WFL for tasks assessed/performed Overall Cognitive Status: Within Functional Limits for tasks assessed  Exercises      General Comments General comments (skin integrity, edema, etc.): VSS, HR 115 at rest most likely due to pain,Dr. Lovick present and aware      Pertinent Vitals/Pain Pain Assessment: 0-10 Pain Score: 7  Pain Location:  surgical site Pain Descriptors / Indicators: Grimacing;Guarding Pain Intervention(s): Monitored during session    Home Living                      Prior Function            PT Goals (current goals can now be found in the care plan section) Acute Rehab PT Goals Patient Stated Goal: to go home today Progress towards PT goals: Progressing toward goals    Frequency    Min 6X/week      PT Plan Current plan remains appropriate    Co-evaluation              AM-PAC PT "6 Clicks" Mobility   Outcome Measure  Help needed turning from your back to your side while in a flat bed without using bedrails?: A Lot Help needed moving from lying on your back to sitting on the side of a flat bed without using bedrails?: A Lot Help needed moving to and from a bed to a chair (including a wheelchair)?: A Little Help needed standing up from a chair using your arms (e.g., wheelchair or bedside chair)?: A Little Help needed to walk in hospital room?: A Little Help needed climbing 3-5 steps with a railing? : A Little 6 Click Score: 16    End of Session Equipment Utilized During Treatment: Gait belt;Back brace Activity Tolerance: Patient tolerated treatment well Patient left: in chair;with call bell/phone within reach;with family/visitor present (OT present) Nurse Communication: Mobility status PT Visit Diagnosis: Other abnormalities of gait and mobility (R26.89);Pain     Time: 5573-2202 PT Time Calculation (min) (ACUTE ONLY): 39 min  Charges:  $Gait Training: 23-37 mins $Therapeutic Activity: 8-22 mins                     Lewis Shock, PT, DPT Acute Rehabilitation Services Pager #: [phone] Office #: [phone]    Iona Hansen 03/03/2021, 2:45 PM

## 2021-03-03 NOTE — TOC Initial Note (Signed)
Transition of Care [facility]) - Initial/Assessment Note    Patient Details  Name: Laura Wyatt MRN: [id number] Date of Birth: 1985-07-21  Transition of Care [facility]) CM/SW Contact:    Glennon Mac, RN Phone Number: 03/03/2021, 3:56 PM  Clinical Narrative:                 Pt fell off a ladder (10-12 feet) because she had a seizure due to epilepsy.Found to have T 7 burst fracture with mild retropulsion, T6 VB fracture, and posterior Mediastinal hematoma. S/p T4-9 fusion, T7 corpectomy on 02/28/2021. Prior to admission, patient independent and living at home with children; her 29 year old son is at bedside currently.  PT recommending home health follow-up; patient is uninsured, but will be referred for charity home health services.  If unable to arrange home health follow-up for PT, patient's son states he can take patient to outpatient appointments, if needed.  Referral to [employer] for recommended DME; 3 in 1, tub/shower seat, and rolling walker to be delivered to bedside prior to discharge. Pt is uninsured, but is eligible for medication assistance through [facility] program.  Would recommend discharge prescriptions be sent to [facility] pharmacy to be filled using MATCH letter.   Expected Discharge Plan: Home w Home Health Services Barriers to Discharge: Continued Medical Work up   Patient Goals and CMS Choice Patient states their goals for this hospitalization and ongoing recovery are:: to go home CMS Medicare.gov Compare Post Acute Care list provided to:: Patient Choice offered to / list presented to : Patient  Expected Discharge Plan and Services Expected Discharge Plan: Home w Home Health Services   Discharge Planning Services: CM Consult Post Acute Care Choice: Home Health Living arrangements for the past 2 months: Single Family Home Expected Discharge Date: 03/03/21               DME Arranged: 3-N-1, Walker rolling, Shower stool DME Agency: [employer] Date DME Agency  Contacted: 03/03/21   Representative spoke with at DME Agency: Velna Hatchet            Prior Living Arrangements/Services Living arrangements for the past 2 months: Single Family Home Lives with:: Minor Children (31 yo son) Patient language and need for interpreter reviewed:: Yes Do you feel safe going back to the place where you live?: Yes      Need for Family Participation in Patient Care: Yes (Comment) Care giver support system in place?: Yes (comment)   Criminal Activity/Legal Involvement Pertinent to Current Situation/Hospitalization: No - Comment as needed  Activities of Daily Living Home Assistive Devices/Equipment: None ADL Screening (condition at time of admission) Patient's cognitive ability adequate to safely complete daily activities?: Yes Is the patient deaf or have difficulty hearing?: No Does the patient have difficulty seeing, even when wearing glasses/contacts?: No Does the patient have difficulty concentrating, remembering, or making decisions?: No Patient able to express need for assistance with ADLs?: No Does the patient have difficulty dressing or bathing?: No Independently performs ADLs?: Yes (appropriate for developmental age) Does the patient have difficulty walking or climbing stairs?: No Weakness of Legs: None Weakness of Arms/Hands: None                   Emotional Assessment Appearance:: Appears stated age Attitude/Demeanor/Rapport: Engaged Affect (typically observed): Accepting Orientation: : Oriented to Self, Oriented to Place, Oriented to  Time, Oriented to Situation      Admission diagnosis:  Low back pain [M54.50] Fall [W19.XXXA] Thoracic spine fracture (  HCC) [S22.009A] Chest pain [R07.9] Patient Active Problem List   Diagnosis Date Noted   Thoracic spine fracture (HCC) 02/27/2021   Recurrent seizures (HCC) 01/28/2020   Epilepsy (HCC) 01/28/2020   Non-compliance with treatment 01/28/2020   PCP:  Patient, No Pcp Per  (Inactive) Pharmacy:   [id number], [place] - [address] AT [facility] [address] [place] [address] Phone: [phone] Fax: [phone]     Social Determinants of Health (SDOH) Interventions    Readmission Risk Interventions No flowsheet data found.  Quintella Baton, RN, BSN  Trauma/Neuro ICU Case Manager [phone]

## 2021-03-03 NOTE — Progress Notes (Signed)
Occupational Therapy Treatment Patient Details Name: Laura Wyatt MRN: [id number] DOB: 1986/03/20 Today's Date: 03/03/2021   History of present illness She fell off a ladder (10-12 feet) because she had a seizure due to epilepsy.Found to have T 7 burst fracture with mild retropulsion, T6 VB fracture, and posterior Mediastinal hematoma. S/p T4-9 fusion, T7 corpectomy on 02/28/2021   OT comments  This 35 yo female seen today to go over use of AE (gave her a kit) and let her practice with some of it, go over tub transfer, go over 3n1 use and adjustment. Son, Jesus in room as well to listen to all the education and post op back handout in Spanish given to patient.    Recommendations for follow up therapy are one component of a multi-disciplinary discharge planning process, led by the attending physician.  Recommendations may be updated based on patient status, additional functional criteria and insurance authorization.    Follow Up Recommendations  No OT follow up;Supervision/Assistance - 24 hour    Equipment Recommendations  3 in 1 bedside commode;Tub/shower seat       Precautions / Restrictions Precautions Precautions: Fall;Back Precaution Booklet Issued: Yes (comment) (in Spanish) Precaution Comments: educated on 3/3 back precautions Required Braces or Orthoses: Spinal Brace Spinal Brace: Thoracolumbosacral orthotic;Applied in sitting position Restrictions Weight Bearing Restrictions: No       Mobility Bed Mobility               General bed mobility comments: in recliner just finishing up with PT when I entered    Transfers Overall transfer level: Needs assistance Equipment used: Rolling walker (2 wheeled) Transfers: Sit to/from Stand Sit to Stand: Supervision              Balance Overall balance assessment: Needs assistance Sitting-balance support: No upper extremity supported;Feet supported Sitting balance-Leahy Scale: Good     Standing balance support:  Bilateral upper extremity supported Standing balance-Leahy Scale: Poor                             ADL either performed or assessed with clinical judgement   ADL Overall ADL's : Needs assistance/impaired                 Upper Body Dressing : Supervision/safety   Lower Body Dressing: Supervision/safety;Set up;Sit to/from stand Lower Body Dressing Details (indicate cue type and reason): Pt practived doffing and donning socks with reacher and sock aid, donning shorts with reacher. Also verbally educated pt on use of long shoe horn and long handled sponge   Toilet Transfer Details (indicate cue type and reason): Educated on how to adjust 3n1 for home use   Toileting - Clothing Manipulation Details (indicate cue type and reason): Educated on use of wet wipes making back peri care easier   Tub/Shower Transfer Details (indicate cue type and reason): Educated on side step into tub at home         Vision Baseline Vision/History: 0 No visual deficits Ability to See in Adequate Light: 0 Adequate            Cognition Arousal/Alertness: Awake/alert Behavior During Therapy: WFL for tasks assessed/performed Overall Cognitive Status: Within Functional Limits for tasks assessed  Pertinent Vitals/ Pain       Pain Assessment: 0-10 Pain Score: 10-Worst pain ever Pain Location: incisional site Pain Descriptors / Indicators: Grimacing;Guarding Pain Intervention(s): Limited activity within patient's tolerance;RN gave pain meds during session;Repositioned         Frequency  Min 2X/week        Progress Toward Goals  OT Goals(current goals can now be found in the care plan section)  Progress towards OT goals:  (All education completed and AE kit issued)  Acute Rehab OT Goals Patient Stated Goal: to go home today OT Goal Formulation: With patient Time For Goal Achievement: 03/16/21 Potential  to Achieve Goals: Good  Plan Discharge plan remains appropriate       AM-PAC OT "6 Clicks" Daily Activity     Outcome Measure   Help from another person eating meals?: None Help from another person taking care of personal grooming?: A Little Help from another person toileting, which includes using toliet, bedpan, or urinal?: A Little Help from another person bathing (including washing, rinsing, drying)?: A Little Help from another person to put on and taking off regular upper body clothing?: A Little Help from another person to put on and taking off regular lower body clothing?: A Little 6 Click Score: 19    End of Session Equipment Utilized During Treatment: Back brace  OT Visit Diagnosis: Other abnormalities of gait and mobility (R26.89);Muscle weakness (generalized) (M62.81);Pain Pain - Right/Left:  (incisioanl) Pain - part of body:  (back)   Activity Tolerance Patient tolerated treatment well   Patient Left in chair;with call bell/phone within reach;with family/visitor present   Nurse Communication Patient requests pain meds        Time: 1310-1331 OT Time Calculation (min): 21 min  Charges: OT General Charges $OT Visit: 1 Visit OT Treatments $Self Care/Home Management : 8-22 mins  Golden Circle, OTR/L Acute [employer] Pager [phone] Office [phone]    Almon Register 03/03/2021, 1:55 PM

## 2021-03-04 ENCOUNTER — Other Ambulatory Visit (HOSPITAL_COMMUNITY): Payer: Self-pay

## 2021-03-04 LAB — BASIC METABOLIC PANEL
Anion gap: 6 (ref 5–15)
BUN: 5 mg/dL — ABNORMAL LOW (ref 6–20)
CO2: 24 mmol/L (ref 22–32)
Calcium: 8.1 mg/dL — ABNORMAL LOW (ref 8.9–10.3)
Chloride: 105 mmol/L (ref 98–111)
Creatinine, Ser: 0.48 mg/dL (ref 0.44–1.00)
GFR, Estimated: >60 mL/min (ref >60)
Glucose, Bld: 90 mg/dL (ref 70–99)
Potassium: 3.5 mmol/L (ref 3.5–5.1)
Sodium: 135 mmol/L (ref 135–145)

## 2021-03-04 LAB — CBC
HCT: 25.4 % — ABNORMAL LOW (ref 36.0–46.0)
Hemoglobin: 8.1 g/dL — ABNORMAL LOW (ref 12.0–15.0)
MCH: 26.1 pg (ref 26.0–34.0)
MCHC: 31.9 g/dL (ref 30.0–36.0)
MCV: 81.9 fL (ref 80.0–100.0)
Platelets: 322 10*3/uL (ref 150–400)
RBC: 3.1 MIL/uL — ABNORMAL LOW (ref 3.87–5.11)
RDW: 15.9 % — ABNORMAL HIGH (ref 11.5–15.5)
WBC: 8.8 10*3/uL (ref 4.0–10.5)
nRBC: 0 % (ref 0.0–0.2)

## 2021-03-04 MED ORDER — POLYETHYLENE GLYCOL 3350 17 G PO PACK
17.0000 g | PACK | Freq: Every day | ORAL | Status: DC
  Administered 2021-03-04: 17 g via ORAL
  Filled 2021-03-04: qty 1

## 2021-03-04 MED ORDER — SENNA 8.6 MG PO TABS
1.0000 | ORAL_TABLET | Freq: Every day | ORAL | Status: DC
  Administered 2021-03-04: 8.6 mg via ORAL
  Filled 2021-03-04: qty 1

## 2021-03-04 MED ORDER — OXYCODONE HCL 10 MG PO TABS
5.0000 mg | ORAL_TABLET | Freq: Four times a day (QID) | ORAL | 0 refills | Status: AC | PRN
  Filled 2021-03-04: qty 25, 7d supply, fill #0

## 2021-03-04 MED ORDER — ACETAMINOPHEN 500 MG PO TABS: 1000.0000 mg | ORAL_TABLET | Freq: Four times a day (QID) | ORAL | 0 refills | Status: AC | PRN

## 2021-03-04 MED ORDER — METHOCARBAMOL 500 MG PO TABS
1000.0000 mg | ORAL_TABLET | Freq: Four times a day (QID) | ORAL | 0 refills | Status: AC | PRN
  Filled 2021-03-04: qty 30, 4d supply, fill #0

## 2021-03-04 MED ORDER — LEVETIRACETAM 750 MG PO TABS
1500.0000 mg | ORAL_TABLET | Freq: Two times a day (BID) | ORAL | 0 refills | Status: AC
  Filled 2021-03-04: qty 60, 15d supply, fill #0

## 2021-03-04 NOTE — Progress Notes (Signed)
Patient D/C to home all D/C instructions covered. Patient and son were informed that home health would be coming to the home. Son was instructed to call and inform them when they arrived to the house. All equipment was sent home with the patient along with medication. The discharge paper work and all PT/OT, and F/U appt. In spanish was provided. Patient was assisted into the car and seatbelt buckled. All questions and concerns were covered.

## 2021-03-04 NOTE — Progress Notes (Signed)
Occupational Therapy Treatment Patient Details Name: Laura Wyatt MRN: [id number] DOB: 06-09-1985 Today's Date: 03/04/2021   History of present illness Pt fell off a ladder (10-12 feet) because she had a seizure due to epilepsy.Found to have T 7 burst fracture with mild retropulsion, T6 VB fracture, and posterior Mediastinal hematoma. S/p T4-9 fusion, T7 corpectomy on 02/28/2021   OT comments  Pt requires (A) for LB dressing at this time limited by pain. Pt will have 86 yo son (A) at home and the AE provided by OT 9/26. Pt and son given return demo of dressing LB this session. Pt with hemovac drain so shirt was not don only gown with brace. Recommendation for AE use and supervision for all transfers initially at home.    Recommendations for follow up therapy are one component of a multi-disciplinary discharge planning process, led by the attending physician.  Recommendations may be updated based on patient status, additional functional criteria and insurance authorization.    Follow Up Recommendations  No OT follow up;Supervision/Assistance - 24 hour    Equipment Recommendations  3 in 1 bedside commode;Tub/shower seat    Recommendations for Other Services      Precautions / Restrictions Precautions hemovac drain in place Precautions: Fall;Back Required Braces or Orthoses: Spinal Brace Spinal Brace: Thoracolumbosacral orthotic;Applied in sitting position       Mobility Bed Mobility Overal bed mobility: Needs Assistance Bed Mobility: Rolling;Supine to Sit;Sit to Supine Rolling: Mod assist Sidelying to sit: Mod assist Supine to sit: Mod assist Sit to supine: Mod assist   General bed mobility comments: pt with HOB elevated and requesting to pull up on son to sit up from bed surface. educated on log rolling and pushing from bed surface with L UE. pt static sitting guarded. pt requires total (A) from OT to don brace. Pt requires mod (A) to doff brace and (A) to lift bil LE onto bed  surface    Transfers Overall transfer level: Needs assistance Equipment used: Rolling walker (2 wheeled) Transfers: Sit to/from Stand Sit to Stand: Min guard         General transfer comment: needs little increaed time to power up from surface    Balance Overall balance assessment: Needs assistance Sitting-balance support: Bilateral upper extremity supported;Feet supported Sitting balance-Leahy Scale: Fair     Standing balance support: Bilateral upper extremity supported;During functional activity Standing balance-Leahy Scale: Poor Standing balance comment: reliant on RW                           ADL either performed or assessed with clinical judgement   ADL Overall ADL's : Needs assistance/impaired     Grooming: Set up;Supervision/safety;Sitting           Upper Body Dressing : Total assistance Upper Body Dressing Details (indicate cue type and reason): to don brace. son present for verbal education Lower Body Dressing: Moderate assistance;Sit to/from stand Lower Body Dressing Details (indicate cue type and reason): don underwear and shorts. educated to just wear shorts if needed to help wiht don doff. the underwear is tight and harder to pull up Toilet Transfer: Minimal assistance;Ambulation;RW;BSC   Toileting- Clothing Manipulation and Hygiene: Minimal assistance Toileting - Clothing Manipulation Details (indicate cue type and reason): static standing to vipe     Functional mobility during ADLs: Minimal assistance;Rolling walker General ADL Comments: pt able to progress to 3n1 over commode this session     Vision  Perception     Praxis      Cognition Arousal/Alertness: Awake/alert Behavior During Therapy: WFL for tasks assessed/performed Overall Cognitive Status: Within Functional Limits for tasks assessed                                          Exercises     Shoulder Instructions       General Comments VSS,  reports pain 8 out 10 and RN arriving with medication during session    Pertinent Vitals/ Pain       Pain Assessment: 0-10 Pain Score: 8  Pain Location: surgical site Pain Descriptors / Indicators: Grimacing;Guarding Pain Intervention(s): Monitored during session;Repositioned;Premedicated before session  Home Living                                          Prior Functioning/Environment              Frequency  Min 2X/week        Progress Toward Goals  OT Goals(current goals can now be found in the care plan section)  Progress towards OT goals: Progressing toward goals  Acute Rehab OT Goals Patient Stated Goal: pt asking if she can go home OT Goal Formulation: With patient Time For Goal Achievement: 03/16/21 Potential to Achieve Goals: Good ADL Goals Pt Will Perform Grooming: standing;with modified independence Pt Will Perform Upper Body Bathing: Independently;sitting;standing Pt Will Perform Lower Body Bathing: with modified independence;sit to/from stand Pt Will Perform Upper Body Dressing: Independently;sitting Pt Will Perform Lower Body Dressing: with modified independence;sit to/from stand Pt Will Transfer to Toilet: with modified independence;ambulating;bedside commode Pt Will Perform Toileting - Clothing Manipulation and hygiene: with modified independence;sit to/from stand Pt Will Perform Tub/Shower Transfer: Tub transfer;with modified independence;ambulating;shower seat;rolling walker Additional ADL Goal #1: Pt will be Mod I in and OOB for basic ADLs Additional ADL Goal #2: Pt will be able to donn and doff brace independently  Plan Discharge plan remains appropriate    Co-evaluation                 AM-PAC OT "6 Clicks" Daily Activity     Outcome Measure   Help from another person eating meals?: None Help from another person taking care of personal grooming?: A Little Help from another person toileting, which includes using  toliet, bedpan, or urinal?: A Little Help from another person bathing (including washing, rinsing, drying)?: A Little Help from another person to put on and taking off regular upper body clothing?: A Little Help from another person to put on and taking off regular lower body clothing?: A Little 6 Click Score: 19    End of Session Equipment Utilized During Treatment: Back brace;Gait belt  OT Visit Diagnosis: Other abnormalities of gait and mobility (R26.89);Muscle weakness (generalized) (M62.81);Pain   Activity Tolerance Patient tolerated treatment well   Patient Left in bed;with call bell/phone within reach;with bed alarm set;with family/visitor present   Nurse Communication Mobility status;Precautions        Time: 0277-4128 OT Time Calculation (min): 19 min  Charges: OT General Charges $OT Visit: 1 Visit OT Treatments $Self Care/Home Management : 8-22 mins   Brynn, OTR/L  Acute Rehabilitation Services Pager: [phone] Office: [phone] .   Mateo Flow 03/04/2021, 1:16 PM

## 2021-03-04 NOTE — Discharge Summary (Signed)
[facility] Discharge Summary   Patient ID: Laura Wyatt MRN: [id number] DOB/AGE: 1985-09-14 35 y.o.  Admit date: 02/27/2021 Discharge date: 03/04/2021  Discharge Diagnosis Fall down stairs T6 vertebral body fracture and T7 burst fracture with mild retropulsion Posterior Mediastinal hematoma  Epilepsy   Consultants Neurology Neurosurgery  Imaging: No results found.  Procedures Dr. Jake Samples (02/28/2021) -  T7 corpectomy, greater than 50%, posterior costotransversectomy approach Placement of anterior biomechanical device, T7 corpectomy device, K2M Capri 13 x 16 expandable cage T4-5, T5-6, T6-7, T7-8, T8-9 posterior lateral instrumentation and arthrodesis Bilateral segmental pedicle screw instrumentation T4 (5.5 mm x 40), T5 (5.5 mm x 40 mm), unilateral T6 on the right (6.5 x 45 mm), bilateral T8 (6.5 x 45 mm), bilateral T9 (6.5 x 45 mm), K2M Everest system Bilateral T6, T7 and T8 laminectomies for decompression of neural elements Intraoperative use of autograft, same incision Intraoperative use of allograft, DBM Intraoperative use of neuro monitoring, SSEPs, greater than 1 hour Intraoperative use of fluoroscopy, greater than 1 hour  Hospital Course:  Laura Wyatt is a 35yo female who presented to [facility] 02/27/21 after falling down the stairs.  She fell down the stairs because she had a seizure. She has had seizures before but she has been taking her medicine and has not had a seizure in a long time. Workup showed T6 vertebral body fracture, T7 burst fracture with mild retropulsion, and Posterior Mediastinal hematoma. Patient was admitted to the trauma service, and injuries managed as below:  T6 vertebral body fracture and T7 burst fracture with mild retropulsion - Neurosurgery was consulted and took the patient to the operating room 02/28/21 for procedure listed above. She was advised to wear TLSO brace when out of bed. Surgical drain removed 03/04/21. Patient will  follow up with Dr. Jake Samples.  Posterior Mediastinal hematoma  - Monitored clinically. This was likely secondary to spinal fractures.  Epilepsy - Neurology was consulted for assistance with management. Ultimately her keppra was increased to 1500mg  BID with PRN ativan. She will need follow up with her neurologist after discharge.  Patient worked with therapies during this admission who recommended home health PT/OT when medically stable for discharge. On 9/27 the patient was felt to be medically stable for discharge home.  Patient will follow up as below and knows to call with questions or concerns.    I have personally reviewed the patients medication history on the [place] controlled substance database.     Allergies as of 03/04/2021   No Known Allergies      Medication List     STOP taking these medications    nitrofurantoin 100 MG capsule Commonly known as: MACRODANTIN       TAKE these medications    acetaminophen 500 MG tablet Commonly known as: TYLENOL Take 2 tablets (1,000 mg total) by mouth every 6 (six) hours as needed for mild pain.   levETIRAcetam 750 MG tablet Commonly known as: KEPPRA Take 2 tablets (1,500 mg total) by mouth 2 (two) times daily. What changed:  medication strength how much to take   methocarbamol 500 MG tablet Commonly known as: ROBAXIN Take 2 tablets (1,000 mg total) by mouth every 6 (six) hours as needed for muscle spasms.   Oxycodone HCl 10 MG Tabs Take 0.5-1 tablets (5-10 mg total) by mouth every 6 (six) hours as needed for moderate pain or severe pain.               Durable Medical Equipment  (From admission, onward)  Start     Ordered   03/03/21 1226  For home use only DME Walker rolling  Once       Question Answer Comment  Walker: With 5 Inch Wheels   Patient needs a walker to treat with the following condition Thoracic spine fracture (HCC)      03/03/21 1225   03/03/21 1225  For home use only DME 3 n 1  Once         03/03/21 1225   03/03/21 1225  For home use only DME Shower stool  Once        03/03/21 1225              Follow-up Information     Dawley, Troy C, DO. Schedule an appointment as soon as possible for a visit in 1 week(s).   Why: Call to arrange follow up regarding recent back surgery Contact information: [address] [place] [place] [address] [phone]         Neurologist. Call.   Why: Follow up with your neurologist regarding seizures        [facility]. Call.   Why: As needed Contact information: [address] [place] [address] [phone]                Signed: Franne Forts, [facility] 03/04/2021, 1:54 PM Please see Amion for pager number during day hours 7:00am-4:30pm

## 2021-03-04 NOTE — Progress Notes (Addendum)
Subjective: Patient reports that she is doing well overall. She has appropriate incsional pain. No acute events overnight.   Objective: Vital signs in last 24 hours: Temp:  [97.8 F (36.6 C)-99.1 F (37.3 C)] 97.8 F (36.6 C) (09/27 0800) Pulse Rate:  [79-108] 91 (09/27 0400) Resp:  [14-22] 14 (09/27 0400) BP: (115-136)/(75-88) 115/75 (09/27 0400) SpO2:  [93 %-97 %] 97 % (09/27 0400)  Intake/Output from previous day: 09/26 0701 - 09/27 0700 In: 559.3 [I.V.:359.3; IV Piggyback:200] Out: 180 [Drains:180] Intake/Output this shift: No intake/output data recorded.  Physical Exam: Patient was asleep but easily arousable by voice. She was oriented x 4, conversant, and in good spirits. She was in NAD and VSS. Doing well. Speech is fluent and appropriate. MAEW. PERLA, EOMI. CNs grossly intact. Drain with 60 cc overnight. Dressing is clean dry intact. Incision is well approximated with no drainage, erythema, or edema.  Lab Results: No results for input(s): WBC, HGB, HCT, PLT in the last 72 hours. BMET No results for input(s): NA, K, CL, CO2, GLUCOSE, BUN, CREATININE, CALCIUM in the last 72 hours.  Studies/Results: No results found.  Assessment/Plan: 35 year old female who is s/p T4-9 fusion, T7 corpectomy on 02/28/2021 due to a fall wher she sustained a T6 left pedicle and posterior inferior vertebral body fracture and an unstable T7 burst fracture with 3 column injury and posterior element fractures. She is continuing to recover well and is slowly improving. She is continuing to await transfer out of the ICU. Will plan to remove drain this morning and possibly discharge this afternoon.      Plan:  -Pain control -TLSO brace when OOB -Continue PT/OT -Remove drain today -Possible discharge this afternoon  LOS: 5 days     Council Mechanic, DNP, NP-C 03/04/2021, 8:53 AM   Addendum:  Agree with above

## 2021-03-04 NOTE — Progress Notes (Signed)
Patient ID: Laura Wyatt, female   DOB: 01-20-1986, 35 y.o.   MRN: [id number] 4 Days Post-Op   Subjective: Sitting up with therapies, reports she ate, no abdominal pain ROS negative except as listed above. Objective: Vital signs in last 24 hours: Temp:  [97.8 F (36.6 C)-99.1 F (37.3 C)] 97.8 F (36.6 C) (09/27 0800) Pulse Rate:  [79-108] 91 (09/27 0400) Resp:  [14-22] 14 (09/27 0400) BP: (115-136)/(75-88) 115/75 (09/27 0400) SpO2:  [93 %-97 %] 97 % (09/27 0400) Last BM Date:  (pta)  Intake/Output from previous day: 09/26 0701 - 09/27 0700 In: 559.3 [I.V.:359.3; IV Piggyback:200] Out: 180 [Drains:180] Intake/Output this shift: No intake/output data recorded.  General appearance: cooperative Back: dressing and drain Resp: clear to auscultation bilaterally Cardio: regular rate and rhythm GI: soft, NT Extremities: moves BLE Neuro: above, A&O  Lab Results: CBC  No results for input(s): WBC, HGB, HCT, PLT in the last 72 hours. BMET No results for input(s): NA, K, CL, CO2, GLUCOSE, BUN, CREATININE, CALCIUM in the last 72 hours. PT/INR No results for input(s): LABPROT, INR in the last 72 hours. ABG No results for input(s): PHART, HCO3 in the last 72 hours.  Invalid input(s): PCO2, PO2  Studies/Results: No results found.  Anti-infectives: Anti-infectives (From admission, onward)    Start     Dose/Rate Route Frequency Ordered Stop   03/01/21 0000  ceFAZolin (ANCEF) IVPB 2g/100 mL premix        2 g 200 mL/hr over 30 Minutes Intravenous Every 8 hours 02/28/21 2020 03/01/21 0815   02/28/21 0600  ceFAZolin (ANCEF) IVPB 2g/100 mL premix        2 g 200 mL/hr over 30 Minutes Intravenous To Short Stay 02/27/21 2200 02/28/21 1631   02/27/21 2015  cefTRIAXone (ROCEPHIN) 1 g in sodium chloride 0.9 % 100 mL IVPB        1 g 200 mL/hr over 30 Minutes Intravenous  Once 02/27/21 2005 02/27/21 2215       Assessment/Plan: Fall down stairs   T6 VB fx and T7 burst fx with  mild retropulsion - NSGY c/s, Dr. Jake Samples, to OR for T4-9 fusion and T7 corpectomy, TLSO when OOB. Drain to be removed today per NSGY - D/W their team at the bedside. Posterior Mediastinal hematoma - monitor clinically, likely 2/2 to spinal fractures Epilepsy - Keppra level 25, increase to 1500mg  PO BID, PRN ativan. Will need to F/U with her Neurologist after D/C FEN - reg diet DVT - SCDs, SQH Foley - spont voids Dispo - 4NP, PT/OT, possible D/C this PM. She is hoping to go.   LOS: 5 days    , MD, MPH, FACS Trauma & General Surgery Use [email or web address] to contact on call provider  03/04/2021

## 2021-03-04 NOTE — Progress Notes (Signed)
Physical Therapy Treatment Patient Details Name: Laura Wyatt MRN: [id number] DOB: 08-Feb-1986 Today's Date: 03/04/2021   History of Present Illness Pt fell off a ladder (10-12 feet) because she had a seizure due to epilepsy.Found to have T 7 burst fracture with mild retropulsion, T6 VB fracture, and posterior Mediastinal hematoma. S/p T4-9 fusion, T7 corpectomy on 02/28/2021    PT Comments    Pt continues to present with pain, poor recall of back precautions, self-limiting actions, and increased assist for bed mobility. Pt given back precaution hand out in spanish and completed stair negotiation again as pt with poor recall. Pt asked son to feed her, her lunch, PT strongly suggested pt try to feed herself. Discussed car transfers and taking pt's mattress off the box spring to make it lower as pt with short stature. Son with good understanding of all precautions and proper transfers and reports he will be doing school from home. Acute PT to cont to follow.    Recommendations for follow up therapy are one component of a multi-disciplinary discharge planning process, led by the attending physician.  Recommendations may be updated based on patient status, additional functional criteria and insurance authorization.  Follow Up Recommendations  Home health PT;Supervision/Assistance - 24 hour     Equipment Recommendations  Rolling walker with 5" wheels (youth walker)    Recommendations for Other Services       Precautions / Restrictions Precautions Precautions: Fall;Back Precaution Booklet Issued: Yes (comment) Precaution Comments: educated on 3/3 back precautions with [place], spanish interpreter present, spanish back hand out provided Required Braces or Orthoses: Spinal Brace Spinal Brace: Thoracolumbosacral orthotic;Applied in sitting position (pt remains dependent to don brace) Restrictions Weight Bearing Restrictions: No Other Position/Activity Restrictions: back precautions      Mobility  Bed Mobility Overal bed mobility: Needs Assistance Bed Mobility: Rolling;Sidelying to Sit Rolling: Min assist Sidelying to sit: Mod assist Supine to sit: Mod assist Sit to supine: Mod assist   General bed mobility comments: pt with improved ability to roll, modA for trunk elevation due to onset pain with effort in pushing self up, HOB flat to mimic home    Transfers Overall transfer level: Needs assistance Equipment used: Rolling walker (2 wheeled) Transfers: Sit to/from Stand Sit to Stand: Min guard         General transfer comment: increased time, guarded, verbal cues for safe hand placement  Ambulation/Gait Ambulation/Gait assistance: Min guard Gait Distance (Feet): 100 Feet Assistive device: Rolling walker (2 wheeled) Gait Pattern/deviations: Step-through pattern;Decreased stride length Gait velocity: slow and guarded Gait velocity interpretation: <1.31 ft/sec, indicative of household ambulator General Gait Details: verbal cues for hand placement on walker   Stairs Stairs: Yes Stairs assistance: Min assist Stair Management: Backwards;With walker (to mimic platform step)   General stair comments: pt with poor recall stating she goes up and down the steps backwards, trialed steps again in which pt with good return demo, son also present   Wheelchair Mobility    Modified Rankin (Stroke Patients Only)       Balance Overall balance assessment: Needs assistance Sitting-balance support: Bilateral upper extremity supported;Feet supported Sitting balance-Leahy Scale: Fair Sitting balance - Comments: pt was able to sit without UE support once encouraged not to lean on L hand   Standing balance support: Bilateral upper extremity supported;During functional activity Standing balance-Leahy Scale: Poor Standing balance comment: reliant on RW  Cognition Arousal/Alertness: Awake/alert Behavior During Therapy: WFL for  tasks assessed/performed Overall Cognitive Status: Within Functional Limits for tasks assessed                                 General Comments: pt self limiting, pt had son feeding her, pt strongly encouraged to feed her self      Exercises      General Comments General comments (skin integrity, edema, etc.): VSS      Pertinent Vitals/Pain Pain Assessment: 0-10 Pain Score: 8  Pain Location: back Pain Descriptors / Indicators: Grimacing;Guarding Pain Intervention(s): Monitored during session    Home Living                      Prior Function            PT Goals (current goals can now be found in the care plan section) Acute Rehab PT Goals Patient Stated Goal: pt asking if she can go home PT Goal Formulation: With patient/family Time For Goal Achievement: 03/16/21 Potential to Achieve Goals: Good Progress towards PT goals: Progressing toward goals    Frequency    Min 6X/week      PT Plan Current plan remains appropriate    Co-evaluation              AM-PAC PT "6 Clicks" Mobility   Outcome Measure  Help needed turning from your back to your side while in a flat bed without using bedrails?: A Lot Help needed moving from lying on your back to sitting on the side of a flat bed without using bedrails?: A Lot Help needed moving to and from a bed to a chair (including a wheelchair)?: A Little Help needed standing up from a chair using your arms (e.g., wheelchair or bedside chair)?: A Little Help needed to walk in hospital room?: A Little Help needed climbing 3-5 steps with a railing? : A Little 6 Click Score: 16    End of Session Equipment Utilized During Treatment: Gait belt;Back brace Activity Tolerance: Patient tolerated treatment well Patient left: in chair;with call bell/phone within reach;with family/visitor present (OT present) Nurse Communication: Mobility status PT Visit Diagnosis: Other abnormalities of gait and mobility  (R26.89);Pain Pain - part of body:  (back)     Time: 1610-9604 PT Time Calculation (min) (ACUTE ONLY): 37 min  Charges:  $Gait Training: 8-22 mins $Therapeutic Activity: 8-22 mins                     Lewis Shock, PT, DPT Acute Rehabilitation Services Pager #: [phone] Office #: [phone]    Iona Hansen 03/04/2021, 1:55 PM

## 2021-03-04 NOTE — TOC Transition Note (Signed)
Transition of Care [facility]) - CM/SW Discharge Note   Patient Details  Name: Verlean Allport MRN: [id number] Date of Birth: 02/12/1986  Transition of Care [facility]) CM/SW Contact:  Glennon Mac, RN Phone Number: 03/04/2021, 4:15 PM   Clinical Narrative:    Patient medically stable for discharge home today with family to assist with care.  [facility] home health physical therapy has been arranged with [facility].  All recommended DME has been delivered to patient's hospital room.  Discharge prescriptions sent to [facility] to be filled using MATCH medication assistance letter.  No other discharge needs identified at this time.   Final next level of care: Home w Home Health Services Barriers to Discharge: Barriers Resolved   Patient Goals and CMS Choice Patient states their goals for this hospitalization and ongoing recovery are:: to go home CMS Medicare.gov Compare Post Acute Care list provided to:: Patient Choice offered to / list presented to : Patient                      Discharge Plan and Services   Discharge Planning Services: CM Consult Post Acute Care Choice: Home Health          DME Arranged: 3-N-1, Walker rolling, Shower stool DME Agency: [employer] Date DME Agency Contacted: 03/03/21   Representative spoke with at DME Agency: Velna Hatchet HH Arranged: PT HH Agency: [facility] Date HH Agency Contacted: 03/04/21 Time HH Agency Contacted: 1158 Representative spoke with at [facility] Agency: Pearson Grippe  Social Determinants of Health (SDOH) Interventions     Readmission Risk Interventions Readmission Risk Prevention Plan 03/04/2021  Post Dischage Appt Complete  Medication Screening Complete  Transportation Screening Complete  Some recent data might be hidden   Quintella Baton, RN, BSN  Trauma/Neuro ICU Case Manager [phone]

## 2021-03-04 NOTE — Progress Notes (Signed)
Patient ID: Laura Wyatt, female   DOB: 08/07/1985, 35 y.o.   MRN: [id number]   Horace Porteous has been removed. Patient is ok to discharge home from a neurosurgery perspective. She will need to follow up in the office next week for staple removal.

## 2021-04-01 ENCOUNTER — Emergency Department
Admission: EM | Admit: 2021-04-01 | Discharge: 2021-04-01 | Disposition: A | Discharge status: Home / Self Care | Payer: Self-pay | Attending: Emergency Medicine | Admitting: Emergency Medicine

## 2021-04-01 ENCOUNTER — Other Ambulatory Visit: Payer: Self-pay

## 2021-04-01 DIAGNOSIS — R1011 Right upper quadrant pain: Secondary | ICD-10-CM | POA: Insufficient documentation

## 2021-04-01 DIAGNOSIS — Z5321 Procedure and treatment not carried out due to patient leaving prior to being seen by health care provider: Secondary | ICD-10-CM | POA: Insufficient documentation

## 2021-04-01 LAB — COMPREHENSIVE METABOLIC PANEL
ALT: 22 U/L (ref 0–44)
AST: 30 U/L (ref 15–41)
Albumin: 4.1 g/dL (ref 3.5–5.0)
Alkaline Phosphatase: 143 U/L — ABNORMAL HIGH (ref 38–126)
Anion gap: 9 (ref 5–15)
BUN: 8 mg/dL (ref 6–20)
CO2: 22 mmol/L (ref 22–32)
Calcium: 9.2 mg/dL (ref 8.9–10.3)
Chloride: 103 mmol/L (ref 98–111)
Creatinine, Ser: 0.55 mg/dL (ref 0.44–1.00)
GFR, Estimated: >60 mL/min (ref >60)
Glucose, Bld: 141 mg/dL — ABNORMAL HIGH (ref 70–99)
Potassium: 3.6 mmol/L (ref 3.5–5.1)
Sodium: 134 mmol/L — ABNORMAL LOW (ref 135–145)
Total Bilirubin: 0.7 mg/dL (ref 0.3–1.2)
Total Protein: 8 g/dL (ref 6.5–8.1)

## 2021-04-01 LAB — CBC
HCT: 34.2 % — ABNORMAL LOW (ref 36.0–46.0)
Hemoglobin: 11.3 g/dL — ABNORMAL LOW (ref 12.0–15.0)
MCH: 26.5 pg (ref 26.0–34.0)
MCHC: 33 g/dL (ref 30.0–36.0)
MCV: 80.3 fL (ref 80.0–100.0)
Platelets: 291 10*3/uL (ref 150–400)
RBC: 4.26 MIL/uL (ref 3.87–5.11)
RDW: 15.1 % (ref 11.5–15.5)
WBC: 9.1 10*3/uL (ref 4.0–10.5)
nRBC: 0 % (ref 0.0–0.2)

## 2021-04-01 LAB — LIPASE, BLOOD: Lipase: 22 U/L (ref 11–51)

## 2021-04-01 NOTE — ED Triage Notes (Signed)
Pt to ED for RUQ pain that started last week. Recent back surgery a few weeks ago. Denies n/v/d MSE Dr Larinda Buttery in triage

## 2021-04-01 NOTE — ED Provider Notes (Signed)
Emergency Medicine Provider Triage Evaluation Note  Steve Gregg , a 35 y.o. female  was evaluated in triage.  Pt complains of upper abdominal pain for about the past week after undergoing recent back surgery.  Review of Systems  Positive: Abdominal pain, nausea. Negative: Chest pain, shortness of breath, fever, cough, vomiting.  Physical Exam  BP 124/87   Pulse 93   Temp 98.3 F (36.8 C) (Oral)   Resp 20   SpO2 99%  Gen:   Awake, no distress, back brace in place. Resp:  Normal effort, lungs clear to auscultation bilaterally. MSK:   Moves extremities without difficulty, 2+ radial pulses bilaterally. Other:  Tenderness to palpation in bilateral upper quadrants of abdomen along with epigastrium.  Medical Decision Making  Medically screening exam initiated at 1:32 PM.  Appropriate orders placed.  Janeshia Ciliberto was informed that the remainder of the evaluation will be completed by another provider, this initial triage assessment does not replace that evaluation, and the importance of remaining in the ED until their evaluation is complete.  35 year old female presents to the ED following recent back surgery with pain in her upper abdomen for about the past week that is reproducible with palpation.  We will check labs and EKG.   Chesley Noon, MD 04/01/21 [phone]

## 2021-06-23 ENCOUNTER — Other Ambulatory Visit: Payer: Self-pay
